# Patient Record
Sex: Female | Born: 1990 | Race: White | Hispanic: No | Marital: Single | Smoking: Never smoker
Health system: Southern US, Community
[De-identification: ages and names within clinical notes are randomized; demographics above are authoritative.]

## PROBLEM LIST (undated history)

## (undated) DIAGNOSIS — D242 Benign neoplasm of left breast: Secondary | ICD-10-CM

## (undated) DIAGNOSIS — D649 Anemia, unspecified: Secondary | ICD-10-CM

## (undated) DIAGNOSIS — F419 Anxiety disorder, unspecified: Secondary | ICD-10-CM

## (undated) DIAGNOSIS — M419 Scoliosis, unspecified: Secondary | ICD-10-CM

## (undated) DIAGNOSIS — D241 Benign neoplasm of right breast: Secondary | ICD-10-CM

## (undated) HISTORY — DX: Benign neoplasm of left breast: D24.2

## (undated) HISTORY — DX: Scoliosis, unspecified: M41.9

## (undated) HISTORY — DX: Anxiety disorder, unspecified: F41.9

## (undated) HISTORY — DX: Anemia, unspecified: D64.9

## (undated) HISTORY — DX: Benign neoplasm of right breast: D24.1

## (undated) HISTORY — PX: BREAST BIOPSY: SHX20

---
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
## 2013-08-24 ENCOUNTER — Ambulatory Visit: Payer: Self-pay | Admitting: Gastroenterology

## 2017-05-07 ENCOUNTER — Ambulatory Visit: Payer: 59 | Admitting: Obstetrics and Gynecology

## 2017-05-07 ENCOUNTER — Encounter: Payer: Self-pay | Admitting: Obstetrics and Gynecology

## 2017-05-07 VITALS — BP 120/80 | HR 97 | Ht 72.0 in | Wt 160.0 lb

## 2017-05-07 DIAGNOSIS — B373 Candidiasis of vulva and vagina: Secondary | ICD-10-CM

## 2017-05-07 DIAGNOSIS — Z113 Encounter for screening for infections with a predominantly sexual mode of transmission: Secondary | ICD-10-CM

## 2017-05-07 DIAGNOSIS — B3731 Acute candidiasis of vulva and vagina: Secondary | ICD-10-CM

## 2017-05-07 DIAGNOSIS — N898 Other specified noninflammatory disorders of vagina: Secondary | ICD-10-CM

## 2017-05-07 LAB — POCT WET PREP WITH KOH
CLUE CELLS WET PREP PER HPF POC: NEGATIVE
KOH Prep POC: NEGATIVE
TRICHOMONAS UA: NEGATIVE
Yeast Wet Prep HPF POC: NEGATIVE

## 2017-05-07 MED ORDER — FLUCONAZOLE 150 MG PO TABS: 150.0000 mg | ORAL_TABLET | Freq: Once | ORAL | 0 refills | Status: AC

## 2017-05-07 NOTE — Progress Notes (Signed)
Chief Complaint  Patient presents with  . Vaginitis    HPI:      [name] is a 26 y.o. G0P0000 who LMP was Patient's last menstrual period was 04/23/2017., presents today for NP eval of increased vag d/c, irritation and swelling, no odor for the past 4 days. She treated last night with 1 dose of monistat-7 with some relief.  She has had a recent new sex partner and would like STD testing.  She is on Y[place] and has noticed a little BTB since yesterday. No late/missed pills.   History reviewed. No pertinent past medical history.  Past Surgical History:  Procedure Laterality Date  . BREAST BIOPSY      Family History  Problem Relation Age of Onset  . Lung cancer Maternal Grandmother     Social History   Socioeconomic History  . Marital status: Single    Spouse name: Not on file  . Number of children: Not on file  . Years of education: Not on file  . Highest education level: Not on file  Social Needs  . Financial resource strain: Not on file  . Food insecurity - worry: Not on file  . Food insecurity - inability: Not on file  . Transportation needs - medical: Not on file  . Transportation needs - non-medical: Not on file  Occupational History  . Not on file  Tobacco Use  . Smoking status: Never Smoker  . Smokeless tobacco: Never Used  Substance and Sexual Activity  . Alcohol use: Yes    Frequency: Never  . Drug use: No  . Sexual activity: Yes    Birth control/protection: Pill  Other Topics Concern  . Not on file  Social History Narrative  . Not on file     Current Outpatient Medications:  .  fluconazole (DIFLUCAN) 150 MG tablet, Take 1 tablet (150 mg total) by mouth once for 1 dose., Disp: 1 tablet, Rfl: 0   ROS:  Review of Systems  Constitutional: Negative for fever.  Gastrointestinal: Negative for blood in stool, constipation, diarrhea, nausea and vomiting.  Genitourinary: Positive for vaginal discharge and vaginal pain. Negative for dyspareunia,  dysuria, flank pain, frequency, hematuria, urgency and vaginal bleeding.  Musculoskeletal: Negative for back pain.  Skin: Negative for rash.     OBJECTIVE:   Vitals:  BP 120/80   Pulse 97   Ht 6' (1.829 m)   Wt 160 lb (72.6 kg)   LMP 04/23/2017   BMI 21.70 kg/m   Physical Exam  Constitutional: She is oriented to person, place, and time and well-developed, well-nourished, and in no distress. Vital signs are normal.  Genitourinary: Uterus normal, cervix normal, right adnexa normal and left adnexa normal. Uterus is not enlarged. Cervix exhibits no motion tenderness and no tenderness. Right adnexum displays no mass and no tenderness. Left adnexum displays no mass and no tenderness. Vulva exhibits erythema and tenderness. Vulva exhibits no exudate, no lesion and no rash. Vagina exhibits no lesion. Thick  odorless  white and vaginal discharge found.  Neurological: She is oriented to person, place, and time.  Vitals reviewed.   Results: Results for orders placed or performed in visit on 05/07/17 (from the past 24 hour(s))  POCT Wet Prep with KOH     Status: Normal   Collection Time: 05/07/17  1:48 PM  Result Value Ref Range   Trichomonas, UA Negative    Clue Cells Wet Prep HPF POC neg    Epithelial Wet Prep HPF  POC  Few, Moderate, Many, Too numerous to count   Yeast Wet Prep HPF POC neg    Bacteria Wet Prep HPF POC  Few   RBC Wet Prep HPF POC     WBC Wet Prep HPF POC     KOH Prep POC Negative Negative     Assessment/Plan: [place] vaginitis - Neg wet prep/pos exam. Pt treated with monistat last night. Rx diflucan. F/u prn.  - Plan: POCT Wet Prep with KOH, fluconazole (DIFLUCAN) 150 MG tablet  Vaginal discharge - Plan: POCT Wet Prep with KOH  Screening for STD (sexually transmitted disease) - Plan: Chlamydia/Gonococcus/Trichomonas, NAA    Meds ordered this encounter  Medications  . fluconazole (DIFLUCAN) 150 MG tablet    Sig: Take 1 tablet (150 mg total) by mouth once for  1 dose.    Dispense:  1 tablet    Refill:  0      Return if symptoms worsen or fail to improve.  Alicia B. Copland, PA-C 05/07/2017 1:50 PM

## 2017-05-07 NOTE — Patient Instructions (Signed)
I value your feedback and entrusting us with your care. If you get a [facility] patient survey, I would appreciate you taking the time to let us know about your experience today. Thank you! 

## 2017-05-10 ENCOUNTER — Telehealth: Payer: Self-pay

## 2017-05-10 LAB — CHLAMYDIA/GONOCOCCUS/TRICHOMONAS, NAA
CHLAMYDIA BY NAA: NEGATIVE
GONOCOCCUS BY NAA: NEGATIVE
Trich vag by NAA: NEGATIVE

## 2017-05-10 NOTE — Telephone Encounter (Signed)
Pt called after hour nurse c/o increased itching and having blood in her discharge. She saw ABC on the 18th and was given diflucan, one dose, and told to use hydrocortisone cream for any itching. [phone]  Left detailed msg to call and schedule appt.

## 2017-05-21 DIAGNOSIS — D649 Anemia, unspecified: Secondary | ICD-10-CM

## 2017-05-21 HISTORY — DX: Anemia, unspecified: D64.9

## 2018-05-21 DIAGNOSIS — D241 Benign neoplasm of right breast: Secondary | ICD-10-CM

## 2018-05-21 HISTORY — DX: Benign neoplasm of right breast: D24.1

## 2018-09-04 ENCOUNTER — Encounter: Payer: Self-pay | Admitting: Obstetrics and Gynecology

## 2018-09-04 ENCOUNTER — Other Ambulatory Visit: Payer: Self-pay

## 2018-09-04 ENCOUNTER — Other Ambulatory Visit (HOSPITAL_COMMUNITY)
Admission: RE | Admit: 2018-09-04 | Discharge: 2018-09-04 | Disposition: A | Discharge status: Home / Self Care | Payer: 59 | Source: Ambulatory Visit | Attending: Obstetrics and Gynecology | Admitting: Obstetrics and Gynecology

## 2018-09-04 ENCOUNTER — Ambulatory Visit (INDEPENDENT_AMBULATORY_CARE_PROVIDER_SITE_OTHER): Payer: 59 | Admitting: Obstetrics and Gynecology

## 2018-09-04 VITALS — BP 116/64 | HR 108 | Ht 72.0 in | Wt 180.0 lb

## 2018-09-04 DIAGNOSIS — Z124 Encounter for screening for malignant neoplasm of cervix: Secondary | ICD-10-CM | POA: Diagnosis present

## 2018-09-04 DIAGNOSIS — Z113 Encounter for screening for infections with a predominantly sexual mode of transmission: Secondary | ICD-10-CM | POA: Insufficient documentation

## 2018-09-04 DIAGNOSIS — N921 Excessive and frequent menstruation with irregular cycle: Secondary | ICD-10-CM

## 2018-09-04 DIAGNOSIS — N6310 Unspecified lump in the right breast, unspecified quadrant: Secondary | ICD-10-CM

## 2018-09-04 DIAGNOSIS — D509 Iron deficiency anemia, unspecified: Secondary | ICD-10-CM

## 2018-09-04 NOTE — Progress Notes (Signed)
Patient, No Pcp Per   Chief Complaint  Patient presents with  . Metrorrhagia    on ocp's, shouldnt have cycles and has been bleeding for the last 3 weeks approx., regular cramping  . STD checking    HPI:      Ms. Blasa Raisch is a 28 y.o. G0P0000 who LMP was Patient's last menstrual period was 08/20/2018 (approximate)., presents today for irregular bleeding on OCPs for 3 wks without late/missed pills. Hx of anemia 12/19 with transfusion 1/20. Pt's home GYN changed her to continuous dosing of OCPs 1/20. Pt has been doing well except for light bleeding the past 3 wks. Pt also with new sex partner so concerned about STD exposure. No vag sx, no pelvic pain. Had 2 yeast vag episodes last month, treated with diflucan with sx relief.   Pt also has noted non-tender, RT breast lump for the past 5 days. Does regular SBE and noticed breast change. Hx of LT fibroadenoma exc a couple yrs ago. Feels the same. No FH breast/ovar ca.  Due for pap.  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   Past Medical History:  Diagnosis Date  . Anemia 2019  . Fibroadenoma of breast, left     Past Surgical History:  Procedure Laterality Date  . BREAST BIOPSY      Family History  Problem Relation Age of Onset  . Thyroid cancer Maternal Grandmother   . Breast cancer Neg Hx   . Ovarian cancer Neg Hx     Social History   Socioeconomic History  . Marital status: Single    Spouse name: Not on file  . Number of children: Not on file  . Years of education: Not on file  . Highest education level: Not on file  Occupational History  . Not on file  Social Needs  . Financial resource strain: Not on file  . Food insecurity:    Worry: Not on file    Inability: Not on file  . Transportation needs:    Medical: Not on file    Non-medical: Not on file  Tobacco Use  . Smoking status: Never Smoker  . Smokeless tobacco: Never Used  Substance and Sexual Activity  .  Alcohol use: Yes    Frequency: Never  . Drug use: No  . Sexual activity: Yes    Birth control/protection: Pill  Lifestyle  . Physical activity:    Days per week: Not on file    Minutes per session: Not on file  . Stress: Not on file  Relationships  . Social connections:    Talks on phone: Not on file    Gets together: Not on file    Attends religious service: Not on file    Active member of club or organization: Not on file    Attends meetings of clubs or organizations: Not on file    Relationship status: Not on file  . Intimate partner violence:    Fear of current or ex partner: Not on file    Emotionally abused: Not on file    Physically abused: Not on file    Forced sexual activity: Not on file  Other Topics Concern  . Not on file  Social History Narrative  . Not on file    Outpatient Medications Prior to Visit  Medication Sig Dispense Refill  . drospirenone-ethinyl estradiol (NIKKI) 3-0.02 MG tablet TAKE 1 TABLET BY MOUTH DAILY    .  escitalopram (LEXAPRO) 10 MG tablet Take by mouth.    . pantoprazole (PROTONIX) 20 MG tablet Take by mouth.     No facility-administered medications prior to visit.       ROS:  Review of Systems  Constitutional: Negative for fatigue, fever and unexpected weight change.  Respiratory: Negative for cough, shortness of breath and wheezing.   Cardiovascular: Negative for chest pain, palpitations and leg swelling.  Gastrointestinal: Negative for blood in stool, constipation, diarrhea, nausea and vomiting.  Endocrine: Negative for cold intolerance, heat intolerance and polyuria.  Genitourinary: Positive for menstrual problem. Negative for dyspareunia, dysuria, flank pain, frequency, genital sores, hematuria, pelvic pain, urgency, vaginal bleeding, vaginal discharge and vaginal pain.  Musculoskeletal: Negative for back pain, joint swelling and myalgias.  Skin: Negative for rash.  Neurological: Negative for dizziness, syncope,  light-headedness, numbness and headaches.  Hematological: Negative for adenopathy.  Psychiatric/Behavioral: Positive for agitation. Negative for confusion, sleep disturbance and suicidal ideas. The patient is not nervous/anxious.    BREAST: mass   OBJECTIVE:   Vitals:  BP 116/64   Pulse (!) 108   Ht 6' (1.829 m)   Wt 180 lb (81.6 kg)   LMP 08/20/2018 (Approximate)   BMI 24.41 kg/m   Physical Exam Vitals signs reviewed.  Constitutional:      Appearance: She is well-developed.  Neck:     Musculoskeletal: Normal range of motion.  Pulmonary:     Effort: Pulmonary effort is normal.  Chest:     Breasts: Breasts are symmetrical.        Right: Mass present. No inverted nipple, nipple discharge, skin change or tenderness.        Left: No inverted nipple, mass, nipple discharge, skin change or tenderness.    Genitourinary:    General: Normal vulva.     Pubic Area: No rash.      Labia:        Right: No rash, tenderness or lesion.        Left: No rash, tenderness or lesion.      Vagina: Normal. No vaginal discharge, erythema or tenderness.     Cervix: Normal.     Uterus: Normal. Not enlarged and not tender.      Adnexa: Right adnexa normal and left adnexa normal.       Right: No mass or tenderness.         Left: No mass or tenderness.       Comments: LIGHT RED D/C TODAY Musculoskeletal: Normal range of motion.  Skin:    General: Skin is warm and dry.  Neurological:     General: No focal deficit present.     Mental Status: She is alert and oriented to person, place, and time.  Psychiatric:        Mood and Affect: Mood normal.        Behavior: Behavior normal.        Thought Content: Thought content normal.        Judgment: Judgment normal.     Assessment/Plan: Breakthrough bleeding on OCPs - Doing continuous dosing since 1/20. Check STDs. If neg, most likely due to cont dosing. Can do placebo pills and then restart active pills.   Cervical cancer screening - Plan:  Cytology - PAP  Screening for STD (sexually transmitted disease) - Plan: Cytology - PAP, CANCELED: Cervicovaginal ancillary only  Breast mass, right - 6:00 under nipple. Check breast u/s. If neg, most likely normal breast tissue. Will f/u with resutls.  -  Plan: US BREAST LTD UNI RIGHT INC AXILLA  Iron deficiency anemia, unspecified iron deficiency anemia type - Question cause. Pt getting further eval with home GYN/PCP.    Return if symptoms worsen or fail to improve.  Sharah Finnell B. Sheretha Shadd, PA-C 09/04/2018 10:17 AM

## 2018-09-04 NOTE — Patient Instructions (Signed)
I value your feedback and entrusting us with your care. If you get a [facility] patient survey, I would appreciate you taking the time to let us know about your experience today. Thank you! 

## 2018-09-06 LAB — CYTOLOGY - PAP
Chlamydia: NEGATIVE
Diagnosis: NEGATIVE
Neisseria Gonorrhea: NEGATIVE

## 2018-09-08 ENCOUNTER — Ambulatory Visit
Admission: RE | Admit: 2018-09-08 | Discharge: 2018-09-08 | Disposition: A | Discharge status: Home / Self Care | Payer: 59 | Source: Ambulatory Visit | Attending: Obstetrics and Gynecology | Admitting: Obstetrics and Gynecology

## 2018-09-08 ENCOUNTER — Telehealth: Payer: Self-pay

## 2018-09-08 ENCOUNTER — Other Ambulatory Visit: Payer: Self-pay

## 2018-09-08 DIAGNOSIS — N6310 Unspecified lump in the right breast, unspecified quadrant: Secondary | ICD-10-CM | POA: Diagnosis not present

## 2018-09-08 NOTE — Progress Notes (Signed)
Pls let pt know pap and STD testing neg. If still having BTB with OCPs, pt should do placebo wk and then start active pills again. F/u prn.

## 2018-09-08 NOTE — Telephone Encounter (Signed)
Patient is returning missed call. Please advise 

## 2018-09-08 NOTE — Telephone Encounter (Signed)
Pt calling for pap results.  [phone]  Mailbox full.

## 2018-09-08 NOTE — Progress Notes (Signed)
Called pt, no answer, could not leave a vm due to mailbox full.

## 2018-09-08 NOTE — Telephone Encounter (Signed)
Pt aware of results and ABC's recommendations.

## 2018-09-09 ENCOUNTER — Encounter: Payer: Self-pay | Admitting: Obstetrics and Gynecology

## 2018-09-09 ENCOUNTER — Other Ambulatory Visit: Payer: Self-pay | Admitting: Obstetrics and Gynecology

## 2018-09-09 DIAGNOSIS — N6310 Unspecified lump in the right breast, unspecified quadrant: Secondary | ICD-10-CM

## 2018-09-11 ENCOUNTER — Telehealth: Payer: Self-pay

## 2018-09-11 ENCOUNTER — Other Ambulatory Visit: Payer: Self-pay | Admitting: Obstetrics and Gynecology

## 2018-09-11 MED ORDER — DROSPIRENONE-ETHINYL ESTRADIOL 3-0.02 MG PO TABS: 1.0000 | ORAL_TABLET | Freq: Every day | ORAL | 0 refills | Status: DC

## 2018-09-11 NOTE — Progress Notes (Signed)
Rx RF till can have annual due to covid

## 2018-09-11 NOTE — Telephone Encounter (Signed)
Rx RF eRxd.  

## 2018-09-11 NOTE — Telephone Encounter (Signed)
Pt saw ABC a few days ago. She is out of refills of birth control. She can't see her PCP who is in [place] who denied her refill request. Cb#[phone].

## 2018-11-29 ENCOUNTER — Other Ambulatory Visit: Payer: Self-pay | Admitting: Obstetrics and Gynecology

## 2018-12-03 ENCOUNTER — Other Ambulatory Visit: Payer: Self-pay

## 2018-12-03 ENCOUNTER — Telehealth: Payer: Self-pay

## 2018-12-03 MED ORDER — DROSPIRENONE-ETHINYL ESTRADIOL 3-0.02 MG PO TABS: 1.0000 | ORAL_TABLET | Freq: Every day | ORAL | 2 refills | Status: DC

## 2018-12-03 NOTE — Telephone Encounter (Signed)
Pt calling for refill of rx.  [phone]

## 2018-12-03 NOTE — Telephone Encounter (Signed)
Patient was seen on 09/04/18 pap smear was done at this visit

## 2018-12-03 NOTE — Telephone Encounter (Signed)
Pt is asking for [facility] RF. Her question was, does she need to come in for "annual visit"?

## 2018-12-03 NOTE — Telephone Encounter (Signed)
Please advise if medication follow up is needed. Does this need to be schedule as telephone consult or in office.

## 2018-12-03 NOTE — Telephone Encounter (Signed)
Called pt, no answer, LVMTRC. 

## 2018-12-03 NOTE — Telephone Encounter (Signed)
Pt says no more BTB. RF sent to pharmacy.

## 2018-12-03 NOTE — Addendum Note (Signed)
Addended by: Cleophas Dunker D on: 12/03/2018 03:35 PM   Modules accepted: Orders

## 2018-12-03 NOTE — Telephone Encounter (Signed)
Pls call pt to find out what is going on with her meds. I can just call her back. Doesn't need appt. Thx.

## 2018-12-03 NOTE — Telephone Encounter (Signed)
Pt aware refill eRx'd. 

## 2018-12-03 NOTE — Telephone Encounter (Signed)
No need for annual since did breast/pelvic/pap at 4/20 appt. Did pt's BTB resolved on OCPs? If so, will send in RF to get her to 4/21.

## 2019-05-28 NOTE — Patient Instructions (Signed)
I value your feedback and entrusting us with your care. If you get a [facility] patient survey, I would appreciate you taking the time to let us know about your experience today. Thank you!  As of April 30, 2019, your lab results will be released to your MyChart immediately, before I even have a chance to see them. Please give me time to review them and contact you if there are any abnormalities. Thank you for your patience.  

## 2019-05-28 NOTE — Progress Notes (Signed)
Patient, No Pcp Per   Chief Complaint  Patient presents with  . Gynecologic Exam    bleeding w/intercourse full menses (amenorrhea on cont'd OCP)    HPI:      [name] is a 29 y.o. G0P0000 who LMP was Patient's last menstrual period was 05/21/2019 (exact date)., presents today for irregular bleeding and STD testing. Pt does cont dosing of OCPs for hx of anemia. Hasn't had bleeding in a long time, but started bleeding a couple wks ago. Pt then did 4 days placebo pills with mod flow cycle and mild dysmen, and restarted active pills 5 days ago. Still having bleeding but it's lighter. Also noticed a strong odor with bleeding. Hx of BV in past and concerned about that. No recent abx use.  She is sex active with new partner, wants STD testing. Not using condoms. No pain/bleeding with sex. Normal pap 4/20.  Patient Active Problem List   Diagnosis Date Noted  . Iron deficiency anemia 09/04/2018    Past Surgical History:  Procedure Laterality Date  . BREAST BIOPSY      Family History  Problem Relation Age of Onset  . Thyroid cancer Maternal Grandmother   . Breast cancer Neg Hx   . Ovarian cancer Neg Hx     Social History   Socioeconomic History  . Marital status: Single    Spouse name: Not on file  . Number of children: Not on file  . Years of education: Not on file  . Highest education level: Not on file  Occupational History  . Not on file  Tobacco Use  . Smoking status: Never Smoker  . Smokeless tobacco: Never Used  Substance and Sexual Activity  . Alcohol use: Yes  . Drug use: No  . Sexual activity: Yes    Birth control/protection: Pill  Other Topics Concern  . Not on file  Social History Narrative  . Not on file   Social Determinants of Health   Financial Resource Strain:   . Difficulty of Paying Living Expenses: Not on file  Food Insecurity:   . Worried About Charity fundraiser in the Last Year: Not on file  . Ran Out of Food in the Last Year:  Not on file  Transportation Needs:   . Lack of Transportation (Medical): Not on file  . Lack of Transportation (Non-Medical): Not on file  Physical Activity:   . Days of Exercise per Week: Not on file  . Minutes of Exercise per Session: Not on file  Stress:   . Feeling of Stress : Not on file  Social Connections:   . Frequency of Communication with Friends and Family: Not on file  . Frequency of Social Gatherings with Friends and Family: Not on file  . Attends Religious Services: Not on file  . Active Member of Clubs or Organizations: Not on file  . Attends Archivist Meetings: Not on file  . Marital Status: Not on file  Intimate Partner Violence:   . Fear of Current or Ex-Partner: Not on file  . Emotionally Abused: Not on file  . Physically Abused: Not on file  . Sexually Abused: Not on file    Outpatient Medications Prior to Visit  Medication Sig Dispense Refill  . drospirenone-ethinyl estradiol (NIKKI) 3-0.02 MG tablet Take 1 tablet by mouth daily. 3 Package 2  . pantoprazole (PROTONIX) 20 MG tablet Take by mouth.    . escitalopram (LEXAPRO) 10 MG tablet Take by mouth.  No facility-administered medications prior to visit.      ROS:  Review of Systems  Constitutional: Negative for fever.  Gastrointestinal: Negative for blood in stool, constipation, diarrhea, nausea and vomiting.  Genitourinary: Positive for menstrual problem. Negative for dyspareunia, dysuria, flank pain, frequency, hematuria, urgency, vaginal bleeding, vaginal discharge and vaginal pain.  Musculoskeletal: Negative for back pain.  Skin: Negative for rash.  Psychiatric/Behavioral: Positive for agitation.    OBJECTIVE:   Vitals:  BP 128/80 (BP Location: Left Arm, Patient Position: Sitting, Cuff Size: Normal)   Pulse (!) 112   Ht 6' (1.829 m)   Wt 197 lb (89.4 kg)   LMP 05/21/2019 (Exact Date) Comment: cotinuous dosing  BMI 26.72 kg/m   Physical Exam Vitals reviewed.   Constitutional:      Appearance: She is well-developed.  Pulmonary:     Effort: Pulmonary effort is normal.  Genitourinary:    General: Normal vulva.     Pubic Area: No rash.      Labia:        Right: No rash, tenderness or lesion.        Left: No rash, tenderness or lesion.      Vagina: Normal. No vaginal discharge, erythema, tenderness or bleeding.     Cervix: Normal.     Uterus: Normal. Not enlarged and not tender.      Adnexa: Right adnexa normal and left adnexa normal.       Right: No mass or tenderness.         Left: No mass or tenderness.       Comments: NO BLEEDING ON VAG EXAM Musculoskeletal:        General: Normal range of motion.     Cervical back: Normal range of motion.  Skin:    General: Skin is warm and dry.  Neurological:     General: No focal deficit present.     Mental Status: She is alert and oriented to person, place, and time.  Psychiatric:        Mood and Affect: Mood normal.        Behavior: Behavior normal.        Thought Content: Thought content normal.        Judgment: Judgment normal.     Results: Results for orders placed or performed in visit on 05/29/19 (from the past 24 hour(s))  POCT Wet Prep with KOH     Status: Normal   Collection Time: 05/29/19  9:28 AM  Result Value Ref Range   Trichomonas, UA Negative    Clue Cells Wet Prep HPF POC neg    Epithelial Wet Prep HPF POC     Yeast Wet Prep HPF POC neg    Bacteria Wet Prep HPF POC     RBC Wet Prep HPF POC     WBC Wet Prep HPF POC     KOH Prep POC Negative Negative  POCT urine pregnancy     Status: Normal   Collection Time: 05/29/19  9:28 AM  Result Value Ref Range   Preg Test, Ur Negative Negative     Assessment/Plan: Breakthrough bleeding on OCPs--Neg UPT, neg exam. Bleeding is improving on active pills. Rule out STD. If neg, reassurance. F/u prn.   Screening for STD (sexually transmitted disease) - Plan: [place] STD  Vaginal odor - Plan: POCT Wet Prep with KOH; neg wet prep, check  STD testing. Will call with results. If neg, could be hormonal from bleeding. F/u prn.  Return if symptoms worsen or fail to improve.  Alyze Lauf B. Chiante Peden, PA-C 05/29/2019 9:30 AM

## 2019-05-29 ENCOUNTER — Encounter: Payer: Self-pay | Admitting: Obstetrics and Gynecology

## 2019-05-29 ENCOUNTER — Other Ambulatory Visit (HOSPITAL_COMMUNITY)
Admission: RE | Admit: 2019-05-29 | Discharge: 2019-05-29 | Disposition: A | Discharge status: Home / Self Care | Payer: Commercial Managed Care - PPO | Source: Ambulatory Visit | Attending: Obstetrics and Gynecology | Admitting: Obstetrics and Gynecology

## 2019-05-29 ENCOUNTER — Other Ambulatory Visit: Payer: Self-pay

## 2019-05-29 ENCOUNTER — Ambulatory Visit (INDEPENDENT_AMBULATORY_CARE_PROVIDER_SITE_OTHER): Payer: Commercial Managed Care - PPO | Admitting: Obstetrics and Gynecology

## 2019-05-29 VITALS — BP 128/80 | HR 112 | Ht 72.0 in | Wt 197.0 lb

## 2019-05-29 DIAGNOSIS — N921 Excessive and frequent menstruation with irregular cycle: Secondary | ICD-10-CM

## 2019-05-29 DIAGNOSIS — N898 Other specified noninflammatory disorders of vagina: Secondary | ICD-10-CM

## 2019-05-29 DIAGNOSIS — Z113 Encounter for screening for infections with a predominantly sexual mode of transmission: Secondary | ICD-10-CM

## 2019-05-29 DIAGNOSIS — Z3202 Encounter for pregnancy test, result negative: Secondary | ICD-10-CM | POA: Diagnosis not present

## 2019-05-29 LAB — POCT WET PREP WITH KOH
Clue Cells Wet Prep HPF POC: NEGATIVE
KOH Prep POC: NEGATIVE
Trichomonas, UA: NEGATIVE
Yeast Wet Prep HPF POC: NEGATIVE

## 2019-05-29 LAB — POCT URINE PREGNANCY: Preg Test, Ur: NEGATIVE

## 2019-06-01 LAB — CERVICOVAGINAL ANCILLARY ONLY
Chlamydia: NEGATIVE
Comment: NEGATIVE
Comment: NORMAL
Neisseria Gonorrhea: NEGATIVE

## 2019-06-02 NOTE — Progress Notes (Signed)
Pls let pt know STD testing neg. Thx.

## 2019-06-02 NOTE — Progress Notes (Signed)
Called pt, no answer, LVMTRC. 

## 2019-06-03 ENCOUNTER — Telehealth: Payer: Self-pay | Admitting: Obstetrics and Gynecology

## 2019-06-03 NOTE — Telephone Encounter (Signed)
Patient is calling for results. Patient is currently at work and she will call back to get results

## 2019-06-03 NOTE — Telephone Encounter (Signed)
Pt aware.

## 2019-06-04 NOTE — Telephone Encounter (Signed)
Pls let pt know to continue active OCPs, even if she is still having BTB. If still has BTB after 3 wks of active pills, then do 7 days placebo pills, have period, and then restart continuous dosing of pills. F/u prn.

## 2019-06-04 NOTE — Telephone Encounter (Signed)
Left voice message advising pt of ABC advice

## 2019-06-04 NOTE — Telephone Encounter (Signed)
Can you pls call this pt to give her ABC's advise. No phone out here.

## 2019-06-30 ENCOUNTER — Telehealth: Payer: Self-pay

## 2019-06-30 NOTE — Telephone Encounter (Signed)
She doesn't need a PCP ref. Call any [facility] office to schedule an appt.

## 2019-06-30 NOTE — Telephone Encounter (Signed)
Pt wants to know if you can put in a referral for her a PCP or a therapist.

## 2019-06-30 NOTE — Telephone Encounter (Signed)
Called pt, no answer, could not leave voice msg due to mail box full. 

## 2019-07-01 NOTE — Telephone Encounter (Signed)
Tried again, no answer, mailbox full. 

## 2019-07-02 NOTE — Telephone Encounter (Signed)
Trying to call pt today again, she called triage. Please let me know when she calls back. She is wanting a referral for PCP and does not need one.

## 2019-11-04 ENCOUNTER — Other Ambulatory Visit: Payer: Self-pay

## 2019-11-04 ENCOUNTER — Ambulatory Visit (INDEPENDENT_AMBULATORY_CARE_PROVIDER_SITE_OTHER): Payer: Commercial Managed Care - PPO | Admitting: Obstetrics and Gynecology

## 2019-11-04 ENCOUNTER — Encounter: Payer: Self-pay | Admitting: Obstetrics and Gynecology

## 2019-11-04 VITALS — BP 110/70 | Ht 71.0 in | Wt 179.0 lb

## 2019-11-04 DIAGNOSIS — N921 Excessive and frequent menstruation with irregular cycle: Secondary | ICD-10-CM

## 2019-11-04 LAB — POCT URINE PREGNANCY: Preg Test, Ur: NEGATIVE

## 2019-11-04 NOTE — Progress Notes (Signed)
Patient, No Pcp Per   Chief Complaint  Patient presents with  . Menstrual Problem    two periods this month    HPI:      [name] is a 29 y.o. G0P0000 whose LMP was Patient's last menstrual period was 11/03/2019 (exact date)., presents today for BTB with OCPs again. Seen 1/21 for similar sx. Pt does cont dosing OCPs for menorrhagia with hx of anemia. Can't go Q3 months without bleeding, so usually goes as long as she can, then starts placebo pills. Has 3-4 day periods, mod flow, no dysmen. Had period 09/06/19. Had bleeding again 10/20/19, did placebo pills, started new pack, on day 6 now and started cramping and bleeding again yesterday. Feels fatigued and worn out. CBC 4/21 with PCP showed iron deficiency anemia with low ferritin. Had neg endoscopy/colonoscopy 9/20. Taking Fe supp daily. Pt is recovering alcoholic for 2 months. Has lost 21# with diet/exercise changes in past 2 months. Is under increased stress, also took zpak end of May. No late/missed pills. Really likes current pills Lexine Baton) and would like to stay on them if possible.   She is sex active. Neg STD testing 1/21. No new partners. Occas has spotting with sex, no pain.   Neg pap 4/20. Was seeing GYN in [place] but has moved back.   Past Medical History:  Diagnosis Date  . Anemia 2019  . Fibroadenoma of breast, left   . Fibroadenoma of breast, right 2020   per u/s    Past Surgical History:  Procedure Laterality Date  . BREAST BIOPSY      Family History  Problem Relation Age of Onset  . Thyroid cancer Maternal Grandmother   . Breast cancer Neg Hx   . Ovarian cancer Neg Hx     Social History   Socioeconomic History  . Marital status: Single    Spouse name: Not on file  . Number of children: Not on file  . Years of education: Not on file  . Highest education level: Not on file  Occupational History  . Not on file  Tobacco Use  . Smoking status: Never Smoker  . Smokeless tobacco: Never Used    Vaping Use  . Vaping Use: Never used  Substance and Sexual Activity  . Alcohol use: Yes  . Drug use: No  . Sexual activity: Yes    Birth control/protection: Pill  Other Topics Concern  . Not on file  Social History Narrative  . Not on file   Social Determinants of Health   Financial Resource Strain:   . Difficulty of Paying Living Expenses:   Food Insecurity:   . Worried About Charity fundraiser in the Last Year:   . Arboriculturist in the Last Year:   Transportation Needs:   . Film/video editor (Medical):   Marland Kitchen Lack of Transportation (Non-Medical):   Physical Activity:   . Days of Exercise per Week:   . Minutes of Exercise per Session:   Stress:   . Feeling of Stress :   Social Connections:   . Frequency of Communication with Friends and Family:   . Frequency of Social Gatherings with Friends and Family:   . Attends Religious Services:   . Active Member of Clubs or Organizations:   . Attends Archivist Meetings:   Marland Kitchen Marital Status:   Intimate Partner Violence:   . Fear of Current or Ex-Partner:   . Emotionally Abused:   Marland Kitchen Physically  Abused:   . Sexually Abused:     Outpatient Medications Prior to Visit  Medication Sig Dispense Refill  . drospirenone-ethinyl estradiol (NIKKI) 3-0.02 MG tablet Take 1 tablet by mouth daily. 3 Package 2  . pantoprazole (PROTONIX) 40 MG tablet Take 40 mg by mouth every morning.    . escitalopram (LEXAPRO) 10 MG tablet Take by mouth.    . pantoprazole (PROTONIX) 20 MG tablet Take by mouth.     No facility-administered medications prior to visit.      ROS:  Review of Systems  Constitutional: Positive for fatigue. Negative for fever.  Gastrointestinal: Negative for blood in stool, constipation, diarrhea, nausea and vomiting.  Genitourinary: Positive for menstrual problem. Negative for dyspareunia, dysuria, flank pain, frequency, hematuria, urgency, vaginal bleeding, vaginal discharge and vaginal pain.   Musculoskeletal: Negative for back pain.  Skin: Negative for rash.    OBJECTIVE:   Vitals:  BP 110/70   Ht 5\' 11"  (1.803 m)   Wt 179 lb (81.2 kg)   LMP 11/03/2019 (Exact Date)   BMI 24.97 kg/m   Physical Exam Vitals reviewed.  Constitutional:      Appearance: She is well-developed.  Pulmonary:     Effort: Pulmonary effort is normal.  Genitourinary:    General: Normal vulva.     Pubic Area: No rash.      Labia:        Right: No rash, tenderness or lesion.        Left: No rash, tenderness or lesion.      Vagina: Bleeding present. No vaginal discharge, erythema or tenderness.     Cervix: Normal.     Uterus: Normal. Not enlarged and not tender.      Adnexa: Right adnexa normal and left adnexa normal.       Right: No mass or tenderness.         Left: No mass or tenderness.    Musculoskeletal:        General: Normal range of motion.     Cervical back: Normal range of motion.  Skin:    General: Skin is warm and dry.  Neurological:     General: No focal deficit present.     Mental Status: She is alert and oriented to person, place, and time.  Psychiatric:        Mood and Affect: Mood normal.        Behavior: Behavior normal.        Thought Content: Thought content normal.        Judgment: Judgment normal.     Results: Results for orders placed or performed in visit on 11/04/19 (from the past 24 hour(s))  POCT urine pregnancy     Status: Normal   Collection Time: 11/04/19  2:46 PM  Result Value Ref Range   Preg Test, Ur Negative Negative     Assessment/Plan: Breakthrough bleeding on OCPs - Plan: POCT urine pregnancy--Neg UPT. Recent wt loss, illness, increased stress. No late/ missed pills. Most likely due to stressors. Finish pill pack and see what cycle does next month. If still with BTB or BTB doesn't stop this cycle, will check labs and GYN u/s. If neg, can switch to different OCP vs depo. Pt loves current pills and would prefer to cont them.     Return if  symptoms worsen or fail to improve.  Rene Gonsoulin B. Gradie Butrick, PA-C 11/04/2019 2:49 PM

## 2019-11-04 NOTE — Patient Instructions (Signed)
I value your feedback and entrusting us with your care. If you get a [employer] patient survey, I would appreciate you taking the time to let us know about your experience today. Thank you!  As of April 30, 2019, your lab results will be released to your MyChart immediately, before I even have a chance to see them. Please give me time to review them and contact you if there are any abnormalities. Thank you for your patience.  

## 2019-12-16 ENCOUNTER — Other Ambulatory Visit: Payer: Self-pay | Admitting: Obstetrics and Gynecology

## 2019-12-17 ENCOUNTER — Telehealth: Payer: Self-pay

## 2019-12-17 DIAGNOSIS — Z1329 Encounter for screening for other suspected endocrine disorder: Secondary | ICD-10-CM

## 2019-12-17 DIAGNOSIS — N921 Excessive and frequent menstruation with irregular cycle: Secondary | ICD-10-CM

## 2019-12-17 DIAGNOSIS — D509 Iron deficiency anemia, unspecified: Secondary | ICD-10-CM

## 2019-12-17 NOTE — Telephone Encounter (Signed)
Pt left msg on triage line asking to talk to ABC about the spotting/bleeding she has been having. Feels bloated. Called her back twice to get more details, no answer, voice mail full.

## 2019-12-17 NOTE — Telephone Encounter (Signed)
Pt would like to schedule an appointment, ABC said she would need a u/s of this continued

## 2019-12-18 ENCOUNTER — Other Ambulatory Visit: Payer: Self-pay | Admitting: Obstetrics and Gynecology

## 2019-12-18 MED ORDER — DROSPIRENONE-ETHINYL ESTRADIOL 3-0.02 MG PO TABS: 1.0000 | ORAL_TABLET | Freq: Every day | ORAL | 0 refills | Status: DC

## 2019-12-18 NOTE — Telephone Encounter (Signed)
I sent in a refill so she can continue her pills. Thx.

## 2019-12-18 NOTE — Telephone Encounter (Signed)
Last question (hopefully), she is on her last pill but doesn't know if she should stop it or what? If she can continue can a refill be sent in?

## 2019-12-18 NOTE — Telephone Encounter (Signed)
No, I'll call her so I can look at labs and u/s. Thx

## 2019-12-18 NOTE — Telephone Encounter (Signed)
Will need u/s order

## 2019-12-18 NOTE — Telephone Encounter (Signed)
scheduled

## 2019-12-18 NOTE — Telephone Encounter (Signed)
U/S order placed (JP). Pt needs labs also. Orders placed. GA--Pls notify pt to get labs (put on lab schedule) with u/s. Doesn't need to be fasting. Thx.

## 2019-12-18 NOTE — Telephone Encounter (Signed)
Thank you :)

## 2019-12-18 NOTE — Progress Notes (Signed)
Rx RF [employer]

## 2019-12-18 NOTE — Telephone Encounter (Signed)
Does she need to see you after u/s or are you going to call her? Right now I have her on u/s sched and to see you after,

## 2019-12-18 NOTE — Telephone Encounter (Signed)
Please see

## 2019-12-21 ENCOUNTER — Other Ambulatory Visit: Payer: Self-pay

## 2019-12-21 ENCOUNTER — Other Ambulatory Visit: Payer: Commercial Managed Care - PPO

## 2019-12-21 ENCOUNTER — Ambulatory Visit (INDEPENDENT_AMBULATORY_CARE_PROVIDER_SITE_OTHER): Payer: Commercial Managed Care - PPO

## 2019-12-21 DIAGNOSIS — N921 Excessive and frequent menstruation with irregular cycle: Secondary | ICD-10-CM

## 2019-12-21 DIAGNOSIS — D509 Iron deficiency anemia, unspecified: Secondary | ICD-10-CM

## 2019-12-21 DIAGNOSIS — Z1329 Encounter for screening for other suspected endocrine disorder: Secondary | ICD-10-CM

## 2019-12-22 ENCOUNTER — Telehealth: Payer: Self-pay | Admitting: Obstetrics and Gynecology

## 2019-12-22 ENCOUNTER — Encounter: Payer: Self-pay | Admitting: Obstetrics and Gynecology

## 2019-12-22 LAB — CBC WITH DIFFERENTIAL/PLATELET
Basophils Absolute: 0 10*3/uL (ref 0.0–0.2)
Basos: 0 %
EOS (ABSOLUTE): 0.1 10*3/uL (ref 0.0–0.4)
Eos: 1 %
Hematocrit: 36.8 % (ref 34.0–46.6)
Hemoglobin: 10.5 g/dL — ABNORMAL LOW (ref 11.1–15.9)
Immature Grans (Abs): 0 10*3/uL (ref 0.0–0.1)
Immature Granulocytes: 0 %
Lymphocytes Absolute: 2 10*3/uL (ref 0.7–3.1)
Lymphs: 27 %
MCH: 21.6 pg — ABNORMAL LOW (ref 26.6–33.0)
MCHC: 28.5 g/dL — ABNORMAL LOW (ref 31.5–35.7)
MCV: 76 fL — ABNORMAL LOW (ref 79–97)
Monocytes Absolute: 0.5 10*3/uL (ref 0.1–0.9)
Monocytes: 7 %
Neutrophils Absolute: 4.7 10*3/uL (ref 1.4–7.0)
Neutrophils: 65 %
Platelets: 375 10*3/uL (ref 150–450)
RBC: 4.85 x10E6/uL (ref 3.77–5.28)
RDW: 15.9 % — ABNORMAL HIGH (ref 11.7–15.4)
WBC: 7.3 10*3/uL (ref 3.4–10.8)

## 2019-12-22 LAB — TSH+FREE T4
Free T4: 1.12 ng/dL (ref 0.82–1.77)
TSH: 2.43 u[IU]/mL (ref 0.450–4.500)

## 2019-12-22 NOTE — Telephone Encounter (Signed)
Patient is calling to follow up on lab results. Patient is currently at work and would like for a detailed message left on voicemail

## 2019-12-22 NOTE — Telephone Encounter (Signed)
LM for pt. GYN u/s WNL for BTB with OCPs. EM very thin. Suggested increasing estrogen dose of OCPs to stabilize bleeding. Asked pt to call me back.  Pt aware of labs. Still waiting for prolactin. Thyroid normal. Still anemic, taking Fe supp. Slightly improved CBC from 4/21.

## 2019-12-23 ENCOUNTER — Other Ambulatory Visit: Payer: Self-pay | Admitting: Obstetrics and Gynecology

## 2019-12-23 MED ORDER — DROSPIRENONE-ETHINYL ESTRADIOL 3-0.03 MG PO TABS: 1.0000 | ORAL_TABLET | Freq: Every day | ORAL | 1 refills | Status: DC

## 2019-12-23 NOTE — Progress Notes (Signed)
Rx yasmin due to BTB with yaz and very thin EM. Increase estrogen dose to see if sx improve.

## 2019-12-24 ENCOUNTER — Ambulatory Visit: Payer: Commercial Managed Care - PPO | Admitting: Obstetrics and Gynecology

## 2019-12-24 ENCOUNTER — Other Ambulatory Visit: Payer: Commercial Managed Care - PPO

## 2020-01-07 ENCOUNTER — Other Ambulatory Visit: Payer: Self-pay | Admitting: Obstetrics and Gynecology

## 2020-06-15 ENCOUNTER — Other Ambulatory Visit: Payer: Self-pay | Admitting: Obstetrics and Gynecology

## 2020-06-16 ENCOUNTER — Telehealth: Payer: Self-pay

## 2020-06-16 MED ORDER — DROSPIRENONE-ETHINYL ESTRADIOL 3-0.03 MG PO TABS: 1.0000 | ORAL_TABLET | Freq: Every day | ORAL | 0 refills | Status: DC

## 2020-06-16 NOTE — Telephone Encounter (Signed)
Rx RF sent, pt aware.

## 2020-06-16 NOTE — Addendum Note (Signed)
Addended by: Drenda Freeze on: 06/16/2020 01:26 PM   Modules accepted: Orders

## 2020-06-16 NOTE — Telephone Encounter (Signed)
Pt left msg on triage requesting BC RF. She is due for an annual. Pls call pt to schedule appt then forward back to me to send RF enough to last to appt.

## 2020-06-16 NOTE — Telephone Encounter (Signed)
Patient coming in on 07/15/2020 at 9:30 with MMF for her annual

## 2020-07-15 ENCOUNTER — Ambulatory Visit: Payer: Commercial Managed Care - PPO | Admitting: Advanced Practice Midwife

## 2020-07-15 ENCOUNTER — Ambulatory Visit: Payer: Commercial Managed Care - PPO | Admitting: Obstetrics

## 2020-07-28 ENCOUNTER — Ambulatory Visit: Payer: Commercial Managed Care - PPO | Admitting: Obstetrics and Gynecology

## 2020-08-08 NOTE — Patient Instructions (Signed)
I value your feedback and you entrusting us with your care. If you get a [facility] patient survey, I would appreciate you taking the time to let us know about your experience today. Thank you! ? ? ?

## 2020-08-08 NOTE — Progress Notes (Signed)
PCP:  Patient, No Pcp Per   Chief Complaint  Patient presents with  . Gynecologic Exam  . Vaginal Odor    Fishy odor, no itchiness or discharge x 2 weeks     HPI:      [name] is a 30 y.o. G0P0000 whose LMP was No LMP recorded. (Menstrual status: Oral contraceptives)., presents today for her annual examination.  Her menses are Q2-3 months with cont dosing OCPs, lasting 5 days.  Dysmenorrhea mild to mod, improved somewhat with NSAIDs. She does not have intermenstrual bleeding anymore.   Sex activity: single partner, contraception - OCP (estrogen/progesterone).  Last Pap: 09/04/18  Results were: no abnormalities  Hx of STDs: none; wants STD testing today.  Has had fishy odor for a couple wks. Started with increased d/c and irritation that resolved. No meds to treat. Hx of BV in past.   There is no FH of breast cancer. There is no FH of ovarian cancer. The patient does do self-breast exams. Hx of stable RT breast fibroadenoma. Would like breast reduction, plans to see plastic surgeon.  Tobacco use: The patient denies current or previous tobacco use. Alcohol use: none No drug use.  Exercise: not active  She does get adequate calcium but not Vitamin D in her diet.   Past Medical History:  Diagnosis Date  . Anemia 2019  . Fibroadenoma of breast, left   . Fibroadenoma of breast, right 2020   per u/s    Past Surgical History:  Procedure Laterality Date  . BREAST BIOPSY      Family History  Problem Relation Age of Onset  . Thyroid cancer Maternal Grandmother   . Breast cancer Neg Hx   . Ovarian cancer Neg Hx     Social History   Socioeconomic History  . Marital status: Single    Spouse name: Not on file  . Number of children: Not on file  . Years of education: Not on file  . Highest education level: Not on file  Occupational History  . Not on file  Tobacco Use  . Smoking status: Never Smoker  . Smokeless tobacco: Never Used  Vaping Use  . Vaping Use:  Never used  Substance and Sexual Activity  . Alcohol use: Yes  . Drug use: No  . Sexual activity: Yes    Birth control/protection: Pill  Other Topics Concern  . Not on file  Social History Narrative  . Not on file   Social Determinants of Health   Financial Resource Strain: Not on file  Food Insecurity: Not on file  Transportation Needs: Not on file  Physical Activity: Not on file  Stress: Not on file  Social Connections: Not on file  Intimate Partner Violence: Not on file     Current Outpatient Medications:  .  escitalopram (LEXAPRO) 20 MG tablet, Take 20 mg by mouth daily., Disp: , Rfl:  .  metroNIDAZOLE (METROGEL) 0.75 % vaginal gel, Place 1 Applicatorful vaginally at bedtime for 5 days., Disp: 50 g, Rfl: 0 .  pantoprazole (PROTONIX) 40 MG tablet, Take 40 mg by mouth every morning., Disp: , Rfl:  .  drospirenone-ethinyl estradiol (YASMIN) 3-0.03 MG tablet, Take 1 tablet by mouth daily. CONTINUOUS DOSING, Disp: 84 tablet, Rfl: 3     ROS:  Review of Systems  Constitutional: Negative for fatigue, fever and unexpected weight change.  Respiratory: Negative for cough, shortness of breath and wheezing.   Cardiovascular: Negative for chest pain, palpitations and leg swelling.  Gastrointestinal: Negative for blood in stool, constipation, diarrhea, nausea and vomiting.  Endocrine: Negative for cold intolerance, heat intolerance and polyuria.  Genitourinary: Positive for vaginal discharge. Negative for dyspareunia, dysuria, flank pain, frequency, genital sores, hematuria, menstrual problem, pelvic pain, urgency, vaginal bleeding and vaginal pain.  Musculoskeletal: Negative for back pain, joint swelling and myalgias.  Skin: Negative for rash.  Neurological: Negative for dizziness, syncope, light-headedness, numbness and headaches.  Hematological: Negative for adenopathy.  Psychiatric/Behavioral: Negative for agitation, confusion, sleep disturbance and suicidal ideas. The patient  is not nervous/anxious.    BREAST: No symptoms   Objective: BP 110/80   Ht 6' (1.829 m)   Wt 184 lb (83.5 kg)   BMI 24.95 kg/m    Physical Exam Constitutional:      Appearance: She is well-developed.  Genitourinary:     Vulva normal.     Right Labia: No rash, tenderness or lesions.    Left Labia: No tenderness, lesions or rash.    No vaginal discharge, erythema or tenderness.      Right Adnexa: not tender and no mass present.    Left Adnexa: not tender and no mass present.    No cervical friability or polyp.     Uterus is not enlarged or tender.  Breasts:     Right: No mass, nipple discharge, skin change or tenderness.     Left: No mass, nipple discharge, skin change or tenderness.    Neck:     Thyroid: No thyromegaly.  Cardiovascular:     Rate and Rhythm: Normal rate and regular rhythm.     Heart sounds: Normal heart sounds. No murmur heard.   Pulmonary:     Effort: Pulmonary effort is normal.     Breath sounds: Normal breath sounds.  Abdominal:     Palpations: Abdomen is soft.     Tenderness: There is no abdominal tenderness. There is no guarding or rebound.  Musculoskeletal:        General: Normal range of motion.     Cervical back: Normal range of motion.  Lymphadenopathy:     Cervical: No cervical adenopathy.  Neurological:     General: No focal deficit present.     Mental Status: She is alert and oriented to person, place, and time.     Cranial Nerves: No cranial nerve deficit.  Skin:    General: Skin is warm and dry.  Psychiatric:        Mood and Affect: Mood normal.        Behavior: Behavior normal.        Thought Content: Thought content normal.        Judgment: Judgment normal.  Vitals reviewed.     Results: Results for orders placed or performed in visit on 08/09/20 (from the past 24 hour(s))  POCT Wet Prep with KOH     Status: Abnormal   Collection Time: 08/09/20  9:27 AM  Result Value Ref Range   Trichomonas, UA Negative    Clue Cells  Wet Prep HPF POC pos    Epithelial Wet Prep HPF POC     Yeast Wet Prep HPF POC neg    Bacteria Wet Prep HPF POC     RBC Wet Prep HPF POC     WBC Wet Prep HPF POC     KOH Prep POC Positive (A) Negative    Assessment/Plan: Encounter for annual routine gynecological examination  Screening for STD (sexually transmitted disease) - Plan: Cervicovaginal ancillary only  Encounter  for surveillance of contraceptive pills - Plan: drospirenone-ethinyl estradiol (YASMIN) 3-0.03 MG tablet; OCP RF cont dosing  Bacterial vaginosis - Plan: POCT Wet Prep with KOH, metroNIDAZOLE (METROGEL) 0.75 % vaginal gel; pos sx and wet prep. Rx metrogel. Will RF prn sx. No EtOH.  Meds ordered this encounter  Medications  . metroNIDAZOLE (METROGEL) 0.75 % vaginal gel    Sig: Place 1 Applicatorful vaginally at bedtime for 5 days.    Dispense:  50 g    Refill:  0    Order Specific Question:   Supervising Provider    Answer:   Gae Dry [id number]  . drospirenone-ethinyl estradiol (YASMIN) 3-0.03 MG tablet    Sig: Take 1 tablet by mouth daily. CONTINUOUS DOSING    Dispense:  84 tablet    Refill:  3    Order Specific Question:   Supervising Provider    Answer:   Gae Dry [[id number]]             GYN counsel adequate intake of calcium and vitamin D, diet and exercise     F/U  Return in about 1 year (around 08/09/2021).  Maylynn Orzechowski B. Carrell Rahmani, PA-C 08/09/2020 9:28 AM

## 2020-08-09 ENCOUNTER — Encounter: Payer: Self-pay | Admitting: Obstetrics and Gynecology

## 2020-08-09 ENCOUNTER — Other Ambulatory Visit: Payer: Self-pay

## 2020-08-09 ENCOUNTER — Ambulatory Visit (INDEPENDENT_AMBULATORY_CARE_PROVIDER_SITE_OTHER): Payer: Commercial Managed Care - PPO | Admitting: Obstetrics and Gynecology

## 2020-08-09 ENCOUNTER — Other Ambulatory Visit (HOSPITAL_COMMUNITY)
Admission: RE | Admit: 2020-08-09 | Discharge: 2020-08-09 | Disposition: A | Discharge status: Home / Self Care | Payer: Commercial Managed Care - PPO | Source: Ambulatory Visit | Attending: Obstetrics and Gynecology | Admitting: Obstetrics and Gynecology

## 2020-08-09 VITALS — BP 110/80 | Ht 72.0 in | Wt 184.0 lb

## 2020-08-09 DIAGNOSIS — Z113 Encounter for screening for infections with a predominantly sexual mode of transmission: Secondary | ICD-10-CM | POA: Diagnosis present

## 2020-08-09 DIAGNOSIS — N760 Acute vaginitis: Secondary | ICD-10-CM | POA: Insufficient documentation

## 2020-08-09 DIAGNOSIS — Z3041 Encounter for surveillance of contraceptive pills: Secondary | ICD-10-CM | POA: Diagnosis not present

## 2020-08-09 DIAGNOSIS — B9689 Other specified bacterial agents as the cause of diseases classified elsewhere: Secondary | ICD-10-CM

## 2020-08-09 DIAGNOSIS — Z01419 Encounter for gynecological examination (general) (routine) without abnormal findings: Secondary | ICD-10-CM | POA: Diagnosis not present

## 2020-08-09 LAB — POCT WET PREP WITH KOH
Clue Cells Wet Prep HPF POC: POSITIVE
KOH Prep POC: POSITIVE — AB
Trichomonas, UA: NEGATIVE
Yeast Wet Prep HPF POC: NEGATIVE

## 2020-08-09 MED ORDER — DROSPIRENONE-ETHINYL ESTRADIOL 3-0.03 MG PO TABS: 1.0000 | ORAL_TABLET | Freq: Every day | ORAL | 3 refills | Status: DC

## 2020-08-09 MED ORDER — METRONIDAZOLE 0.75 % VA GEL: 1.0000 | Freq: Every day | VAGINAL | 0 refills | Status: DC

## 2020-08-10 LAB — CERVICOVAGINAL ANCILLARY ONLY
Chlamydia: NEGATIVE
Comment: NEGATIVE
Comment: NORMAL
Neisseria Gonorrhea: NEGATIVE

## 2020-08-25 ENCOUNTER — Ambulatory Visit: Payer: Commercial Managed Care - PPO | Admitting: Obstetrics and Gynecology

## 2020-09-16 ENCOUNTER — Telehealth: Payer: Self-pay

## 2020-09-16 NOTE — Telephone Encounter (Signed)
Pt calling; BV sxs are back; can rx be sent in?  Also needs labs explained to her.  [phone]

## 2020-09-19 ENCOUNTER — Other Ambulatory Visit: Payer: Self-pay | Admitting: Obstetrics and Gynecology

## 2020-09-19 ENCOUNTER — Telehealth: Payer: Self-pay

## 2020-09-19 DIAGNOSIS — N760 Acute vaginitis: Secondary | ICD-10-CM

## 2020-09-19 MED ORDER — METRONIDAZOLE 0.75 % VA GEL: 1.0000 | Freq: Every day | VAGINAL | 0 refills | Status: DC

## 2020-09-19 NOTE — Telephone Encounter (Signed)
Pt aware.

## 2020-09-19 NOTE — Telephone Encounter (Signed)
Pt calling; believes BV is back; can she have a refill or change to a pill?  [phone]

## 2020-09-19 NOTE — Telephone Encounter (Signed)
Rx RF eRxd.  

## 2020-09-19 NOTE — Progress Notes (Signed)
Rx RF metrogel for recurrent BV

## 2020-10-06 ENCOUNTER — Telehealth: Payer: Self-pay

## 2020-10-06 NOTE — Telephone Encounter (Signed)
Pt states she has a bruise on her left breast she's not sure how it got there and how long it's been there. Patient states its not painful. I advised the patient that I would give you this message. Also that she may want to call her POC because there may be some labs that they may want to do to follow up on the bruising. But if you wanted her that someone would call her back to set up appointment.

## 2020-10-06 NOTE — Telephone Encounter (Signed)
Pt to give time for bruise to go away like normal bruises. If persists over 2 wks, RTO for breast exam. Reassurance.

## 2020-10-06 NOTE — Telephone Encounter (Signed)
Pt aware.

## 2020-12-10 IMAGING — US ULTRASOUND RIGHT BREAST LIMITED
1 series · 8 of 8 positions shown · non-contrast
Comparison: None.

CLINICAL DATA: 27-year-old with personal history of fibroadenoma
excision from the LEFT breast, presenting with a new palpable lump
involving the LOWER periareolar RIGHT breast.

EXAM:
ULTRASOUND OF THE RIGHT BREAST

[Series 1: ultrasound right breast limited · 0.05mm/px · 8 of 8 slices shown]
[im 1/8]
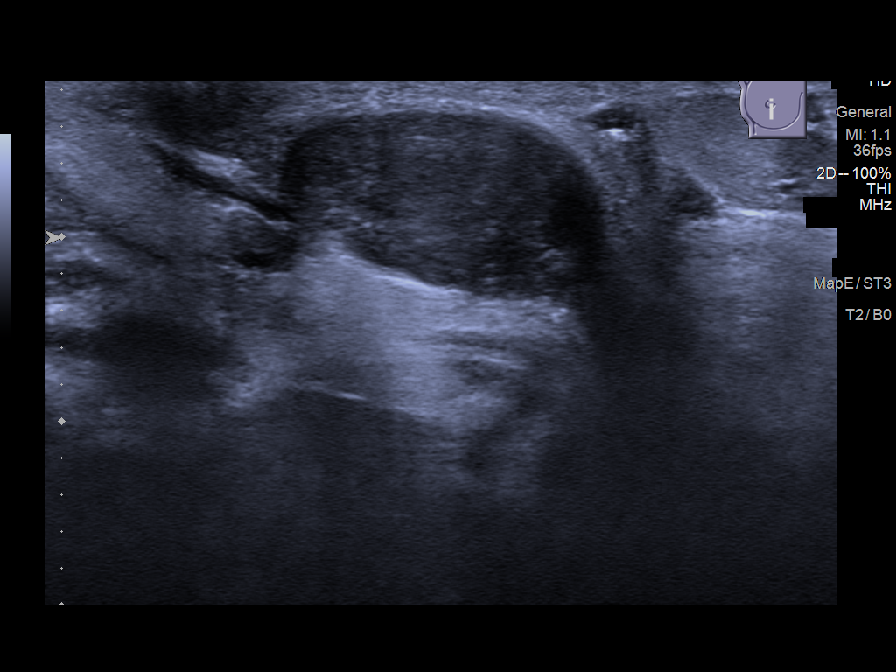
[im 2/8]
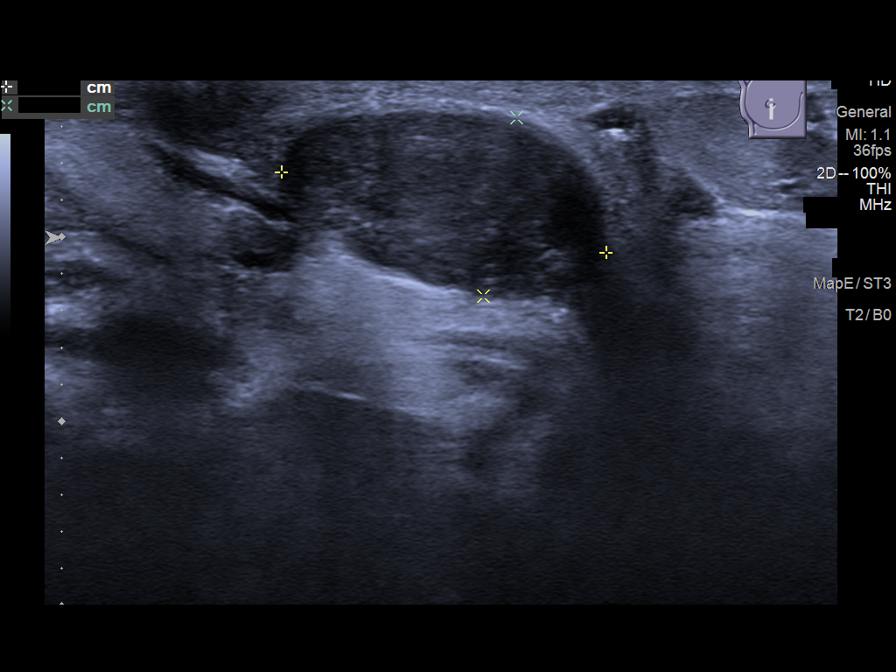
[im 3/8]
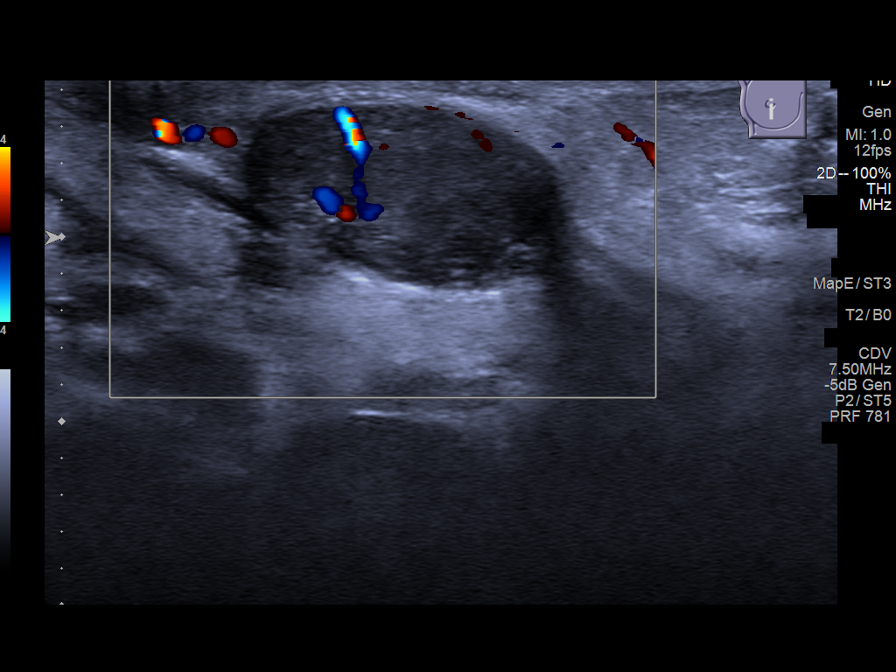
[im 4/8]
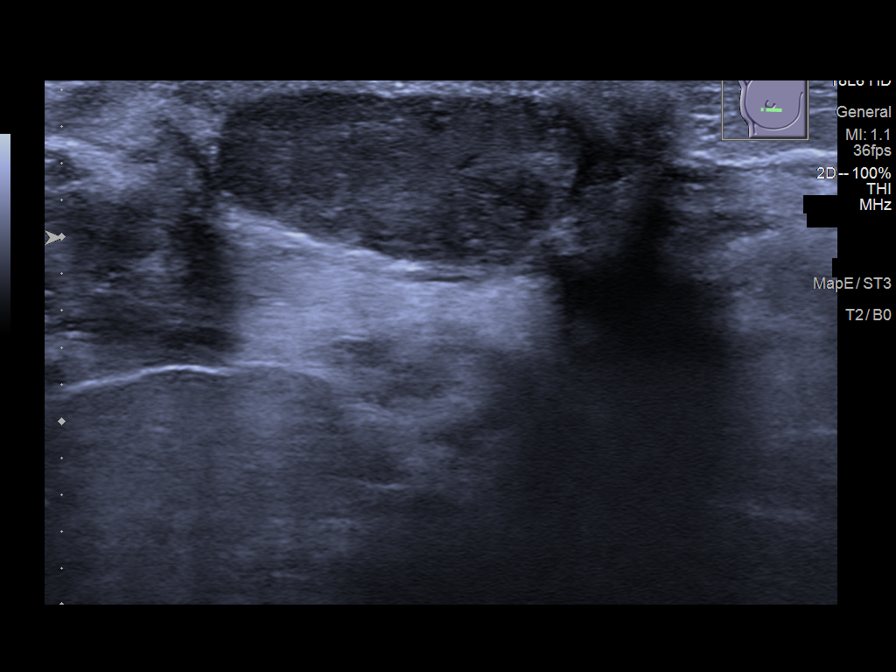
[im 5/8]
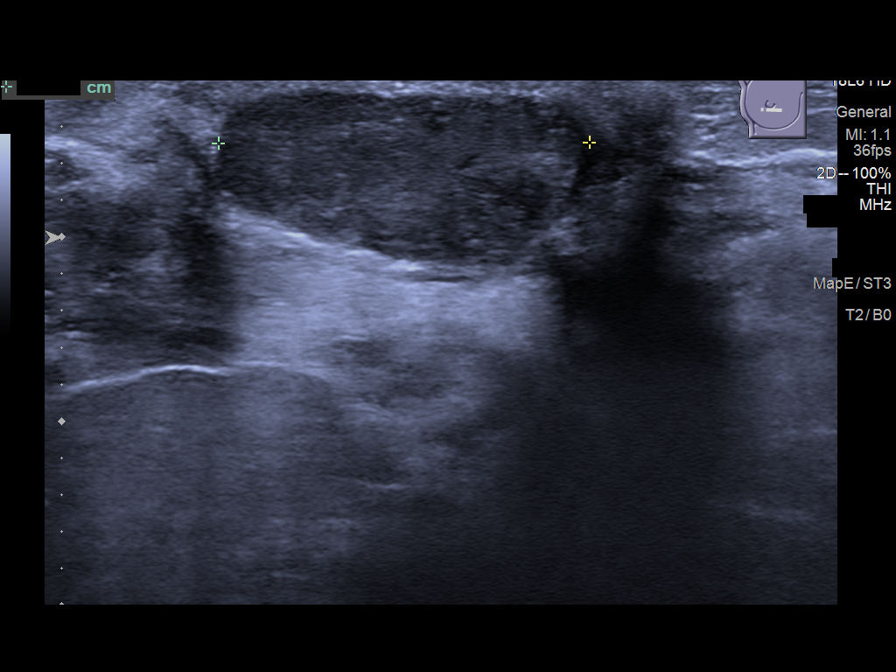
[im 6/8]
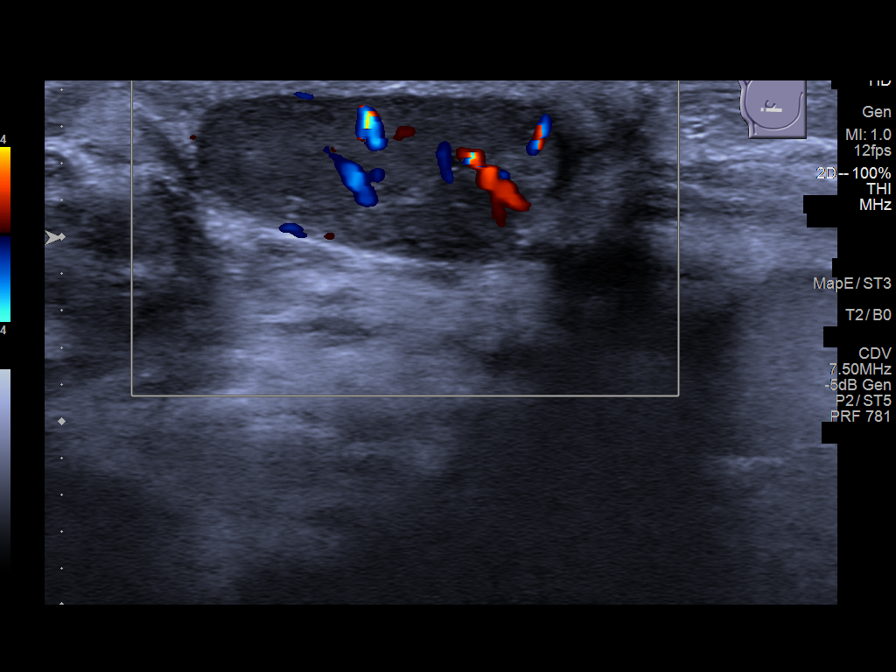
[im 7/8]
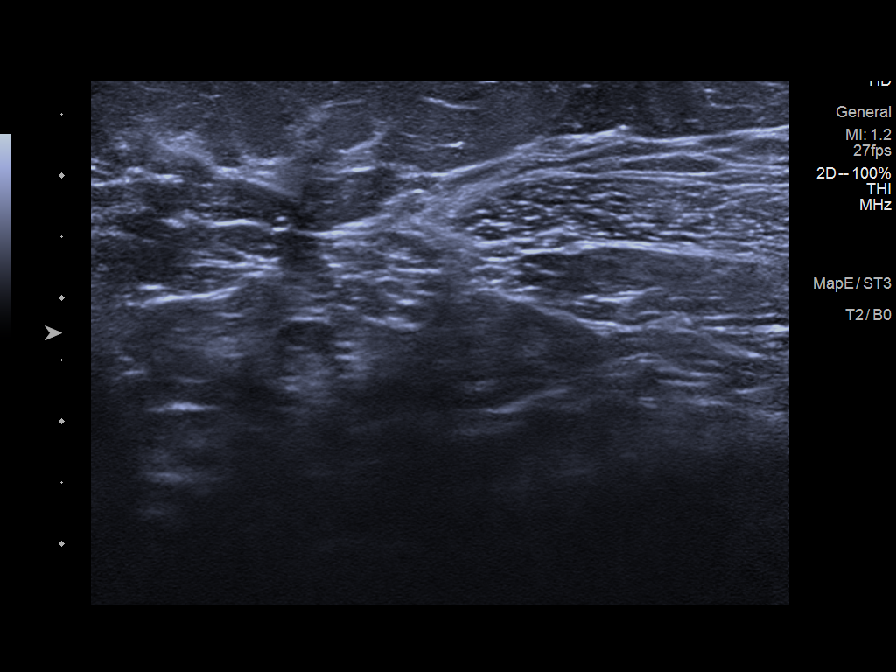
[im 8/8]
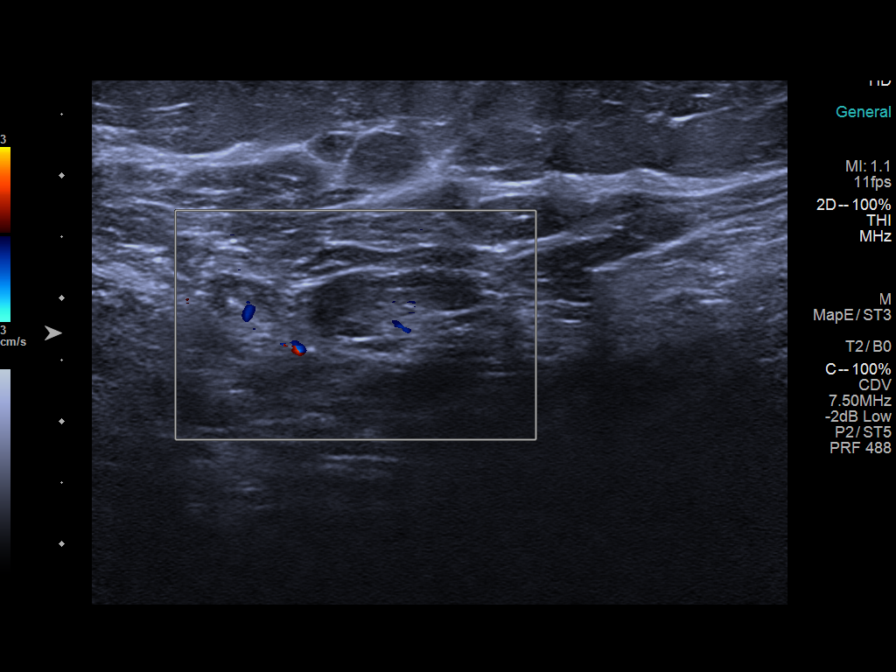

[8 of 8 positions shown; findings below may reference images not displayed]

FINDINGS: On physical exam, there is a mobile palpable approximate 2 cm lump
in the LOWER periareolar RIGHT breast.

Targeted RIGHT breast ultrasound is performed, showing an oval
circumscribed parallel hypoechoic mass at the 6 o'clock location
approximately 1 cm from the nipple measuring approximately 1.0 x
x 2.0 cm, demonstrating posterior acoustic enhancement and
demonstrating internal color Doppler flow, corresponding to the
palpable concern.

Sonographic evaluation of the RIGHT axilla demonstrates no
pathologic lymphadenopathy.
IMPRESSION: 1. Likely benign approximate 2 cm fibroadenoma involving the LOWER
periareolar RIGHT breast accounting for the palpable concern.
2. No pathologic RIGHT axillary lymphadenopathy.

RECOMMENDATION:
We discussed management options for the likely benign fibroadenoma
including excision, ultrasound-guided core biopsy, and short term
interval follow-up. Follow-up ultrasound is recommended at 6, 12 and
24 months to assess stability. The patient agrees with this plan.

I have discussed the findings and recommendations with the patient.
If applicable, a reminder letter will be sent to the patient
regarding the next appointment.

BI-RADS CATEGORY  3: Probably benign.

## 2021-02-06 ENCOUNTER — Other Ambulatory Visit (HOSPITAL_COMMUNITY)
Admission: RE | Admit: 2021-02-06 | Discharge: 2021-02-06 | Disposition: A | Discharge status: Home / Self Care | Payer: Commercial Managed Care - PPO | Source: Ambulatory Visit | Attending: Obstetrics and Gynecology | Admitting: Obstetrics and Gynecology

## 2021-02-06 ENCOUNTER — Other Ambulatory Visit: Payer: Self-pay

## 2021-02-06 ENCOUNTER — Ambulatory Visit: Payer: Commercial Managed Care - PPO | Admitting: Obstetrics and Gynecology

## 2021-02-06 ENCOUNTER — Encounter: Payer: Self-pay | Admitting: Obstetrics and Gynecology

## 2021-02-06 VITALS — BP 100/60 | Ht 72.0 in | Wt 188.0 lb

## 2021-02-06 DIAGNOSIS — Z202 Contact with and (suspected) exposure to infections with a predominantly sexual mode of transmission: Secondary | ICD-10-CM

## 2021-02-06 DIAGNOSIS — D500 Iron deficiency anemia secondary to blood loss (chronic): Secondary | ICD-10-CM | POA: Diagnosis not present

## 2021-02-06 DIAGNOSIS — Z113 Encounter for screening for infections with a predominantly sexual mode of transmission: Secondary | ICD-10-CM

## 2021-02-06 NOTE — Patient Instructions (Signed)
I value your feedback and you entrusting us with your care. If you get a [facility] patient survey, I would appreciate you taking the time to let us know about your experience today. Thank you! ? ? ?

## 2021-02-06 NOTE — Progress Notes (Signed)
Patient, No Pcp Per (Inactive)   Chief Complaint  Patient presents with   STD testing     HPI:      [name] is a 30 y.o. G0P0000 whose LMP was No LMP recorded. (Menstrual status: Oral contraceptives)., presents today for STD testing. Ex partner notified pt today he has a penile d/c and is getting tested. Last sex encounter with him was a month ago. Using OCPs, no condoms. LMP 3 wks ago. Pt denies and vag or urin sx. No LBP, pelvic pain, fevers.  Neg STD testing 3/22 Hx of IDA on 8/21 labs, taking MVI with Fe, would like CBC today.  Past Medical History:  Diagnosis Date   Anemia 2019   Fibroadenoma of breast, left    Fibroadenoma of breast, right 2020   per u/s    Past Surgical History:  Procedure Laterality Date   BREAST BIOPSY      Family History  Problem Relation Age of Onset   Thyroid cancer Maternal Grandmother    Breast cancer Neg Hx    Ovarian cancer Neg Hx     Social History   Socioeconomic History   Marital status: Single    Spouse name: Not on file   Number of children: Not on file   Years of education: Not on file   Highest education level: Not on file  Occupational History   Not on file  Tobacco Use   Smoking status: Never   Smokeless tobacco: Never  Vaping Use   Vaping Use: Never used  Substance and Sexual Activity   Alcohol use: Yes   Drug use: No   Sexual activity: Yes    Birth control/protection: Pill  Other Topics Concern   Not on file  Social History Narrative   Not on file   Social Determinants of Health   Financial Resource Strain: Not on file  Food Insecurity: Not on file  Transportation Needs: Not on file  Physical Activity: Not on file  Stress: Not on file  Social Connections: Not on file  Intimate Partner Violence: Not on file    Outpatient Medications Prior to Visit  Medication Sig Dispense Refill   drospirenone-ethinyl estradiol (YASMIN) 3-0.03 MG tablet Take 1 tablet by mouth daily. CONTINUOUS DOSING 84  tablet 3   pantoprazole (PROTONIX) 40 MG tablet Take 40 mg by mouth every morning.     escitalopram (LEXAPRO) 20 MG tablet Take 20 mg by mouth daily. (Patient not taking: Reported on 02/06/2021)     No facility-administered medications prior to visit.      ROS:  Review of Systems  Constitutional:  Negative for fever.  Gastrointestinal:  Negative for blood in stool, constipation, diarrhea, nausea and vomiting.  Genitourinary:  Negative for dyspareunia, dysuria, flank pain, frequency, hematuria, urgency, vaginal bleeding, vaginal discharge and vaginal pain.  Musculoskeletal:  Negative for back pain.  Skin:  Negative for rash.  BREAST: No symptoms   OBJECTIVE:   Vitals:  BP 100/60   Ht 6' (1.829 m)   Wt 188 lb (85.3 kg)   BMI 25.50 kg/m   Physical Exam Vitals reviewed.  Constitutional:      Appearance: She is well-developed.  Pulmonary:     Effort: Pulmonary effort is normal.  Genitourinary:    General: Normal vulva.     Pubic Area: No rash.      Labia:        Right: No rash, tenderness or lesion.  Left: No rash, tenderness or lesion.      Vagina: Normal. No vaginal discharge, erythema or tenderness.     Cervix: Normal.     Uterus: Normal. Not enlarged and not tender.      Adnexa: Right adnexa normal and left adnexa normal.       Right: No mass or tenderness.         Left: No mass or tenderness.    Musculoskeletal:        General: Normal range of motion.     Cervical back: Normal range of motion.  Skin:    General: Skin is warm and dry.  Neurological:     General: No focal deficit present.     Mental Status: She is alert and oriented to person, place, and time.  Psychiatric:        Mood and Affect: Mood normal.        Behavior: Behavior normal.        Thought Content: Thought content normal.        Judgment: Judgment normal.    Assessment/Plan: Screening for STD (sexually transmitted disease) - Plan: HIV Antibody (routine testing w rflx), RPR,  Hepatitis C antibody, HSV 2 antibody, IgG; labs today. Will f/u with results.   STD exposure  Iron deficiency anemia due to chronic blood loss - Plan: CBC with Differential/Platelet; pt taking MVI with Fe.     Return if symptoms worsen or fail to improve.  Tashay Bozich B. Bently Wyss, PA-C 02/06/2021 4:48 PM

## 2021-02-06 NOTE — Addendum Note (Signed)
Addended by: Ardeth Perfect B on: [id number] 04:58 PM   Modules accepted: Orders

## 2021-02-07 LAB — CBC WITH DIFFERENTIAL/PLATELET
Basophils Absolute: 0.1 10*3/uL (ref 0.0–0.2)
Basos: 1 %
EOS (ABSOLUTE): 0.1 10*3/uL (ref 0.0–0.4)
Eos: 1 %
Hematocrit: 35.7 % (ref 34.0–46.6)
Hemoglobin: 11.1 g/dL (ref 11.1–15.9)
Immature Grans (Abs): 0 10*3/uL (ref 0.0–0.1)
Immature Granulocytes: 0 %
Lymphocytes Absolute: 2 10*3/uL (ref 0.7–3.1)
Lymphs: 24 %
MCH: 25.1 pg — ABNORMAL LOW (ref 26.6–33.0)
MCHC: 31.1 g/dL — ABNORMAL LOW (ref 31.5–35.7)
MCV: 81 fL (ref 79–97)
Monocytes Absolute: 0.5 10*3/uL (ref 0.1–0.9)
Monocytes: 5 %
Neutrophils Absolute: 5.7 10*3/uL (ref 1.4–7.0)
Neutrophils: 69 %
Platelets: 429 10*3/uL (ref 150–450)
RBC: 4.42 x10E6/uL (ref 3.77–5.28)
RDW: 15.9 % — ABNORMAL HIGH (ref 11.7–15.4)
WBC: 8.3 10*3/uL (ref 3.4–10.8)

## 2021-02-07 LAB — HEPATITIS C ANTIBODY: Hep C Virus Ab: <0.1 s/co ratio (ref 0.0–0.9)

## 2021-02-07 LAB — HIV ANTIBODY (ROUTINE TESTING W REFLEX): HIV Screen 4th Generation wRfx: NONREACTIVE

## 2021-02-07 LAB — HSV 2 ANTIBODY, IGG: HSV 2 IgG, Type Spec: <0.91 index (ref 0.00–0.90)

## 2021-02-07 LAB — RPR: RPR Ser Ql: NONREACTIVE

## 2021-02-08 LAB — CERVICOVAGINAL ANCILLARY ONLY
Chlamydia: NEGATIVE
Comment: NEGATIVE
Comment: NEGATIVE
Comment: NORMAL
Neisseria Gonorrhea: NEGATIVE
Trichomonas: NEGATIVE

## 2021-02-09 ENCOUNTER — Encounter: Payer: Self-pay | Admitting: Obstetrics and Gynecology

## 2021-02-09 ENCOUNTER — Telehealth: Payer: Self-pay

## 2021-02-09 NOTE — Telephone Encounter (Signed)
Pt calling for test results.  [phone]  Mailbox is full.

## 2021-02-09 NOTE — Telephone Encounter (Signed)
Pt aware of results 

## 2021-02-09 NOTE — Telephone Encounter (Signed)
Pt returned your call about test results.

## 2021-03-23 ENCOUNTER — Other Ambulatory Visit: Payer: Self-pay | Admitting: Obstetrics and Gynecology

## 2021-03-23 DIAGNOSIS — Z3041 Encounter for surveillance of contraceptive pills: Secondary | ICD-10-CM

## 2021-03-23 NOTE — Telephone Encounter (Signed)
Pls do PA for continuous dosing for menorrhagia with hx of iron deficiency anemia. Thx.

## 2021-03-29 NOTE — Telephone Encounter (Signed)
PA submitted yesterday, waiting on response.

## 2021-04-03 NOTE — Telephone Encounter (Signed)
Received fax this morning that claim had been denied due to PA forms not received. Called the PA department to ask about forms I faxed and I was told system was not showing anything about claim being denied. I was told insurance had paid [facility] on 03/30/21 for the medication so the Customer Service rep advised to call pt's local pharmacy since she did not know how to help me further. I called pt's pharmacy and spoke to [facility] and was told pt had picked up the medication and looked like insurance paid for it. No further issues at this point.

## 2021-04-04 ENCOUNTER — Other Ambulatory Visit: Payer: Self-pay | Admitting: Obstetrics and Gynecology

## 2021-04-04 DIAGNOSIS — Z3041 Encounter for surveillance of contraceptive pills: Secondary | ICD-10-CM

## 2021-04-04 MED ORDER — DROSPIRENONE-ETHINYL ESTRADIOL 3-0.03 MG PO TABS: ORAL_TABLET | ORAL | 1 refills | Status: DC

## 2021-06-27 ENCOUNTER — Encounter: Payer: Self-pay | Admitting: Obstetrics and Gynecology

## 2021-06-28 ENCOUNTER — Other Ambulatory Visit: Payer: Self-pay | Admitting: Obstetrics and Gynecology

## 2021-06-28 DIAGNOSIS — B9689 Other specified bacterial agents as the cause of diseases classified elsewhere: Secondary | ICD-10-CM

## 2021-06-28 DIAGNOSIS — N760 Acute vaginitis: Secondary | ICD-10-CM

## 2021-06-28 MED ORDER — METRONIDAZOLE 0.75 % VA GEL: 1.0000 | Freq: Every day | VAGINAL | 0 refills | Status: AC

## 2021-06-28 NOTE — Progress Notes (Signed)
Rx RF metrogel for BV sx. 

## 2021-09-22 ENCOUNTER — Other Ambulatory Visit: Payer: Self-pay | Admitting: Obstetrics and Gynecology

## 2021-09-22 DIAGNOSIS — Z3041 Encounter for surveillance of contraceptive pills: Secondary | ICD-10-CM

## 2021-09-25 ENCOUNTER — Telehealth: Payer: Self-pay

## 2021-09-25 ENCOUNTER — Other Ambulatory Visit: Payer: Self-pay | Admitting: Obstetrics and Gynecology

## 2021-09-25 DIAGNOSIS — Z3041 Encounter for surveillance of contraceptive pills: Secondary | ICD-10-CM

## 2021-09-25 NOTE — Telephone Encounter (Signed)
Pt wanting a refill on her OCP's but over due for annual . Can you help get her scheduled please? Then I will send in one refill  ?

## 2021-09-29 ENCOUNTER — Other Ambulatory Visit: Payer: Self-pay | Admitting: Obstetrics and Gynecology

## 2021-09-29 ENCOUNTER — Encounter: Payer: Self-pay | Admitting: Obstetrics and Gynecology

## 2021-09-29 DIAGNOSIS — Z3041 Encounter for surveillance of contraceptive pills: Secondary | ICD-10-CM

## 2021-09-29 MED ORDER — DROSPIRENONE-ETHINYL ESTRADIOL 3-0.03 MG PO TABS: ORAL_TABLET | ORAL | 0 refills | Status: DC

## 2021-09-29 NOTE — Addendum Note (Signed)
Addended by: Drenda Freeze on: 09/29/2021 04:15 PM ? ? Modules accepted: Orders ? ?

## 2021-09-29 NOTE — Telephone Encounter (Signed)
Patient is scheduled for 11/12/21 with ABC

## 2021-10-23 ENCOUNTER — Encounter: Payer: Self-pay | Admitting: Obstetrics and Gynecology

## 2021-10-23 MED ORDER — METRONIDAZOLE 0.75 % VA GEL: 1.0000 | Freq: Every day | VAGINAL | 0 refills | Status: DC

## 2021-11-12 NOTE — Progress Notes (Deleted)
PCP:  Patient, No Pcp Per   No chief complaint on file.    HPI:      [name] is a 31 y.o. G0P0000 whose LMP was No LMP recorded. (Menstrual status: Oral contraceptives)., presents today for her annual examination.  Her menses are Q2-3 months with cont dosing OCPs, lasting 5 days.  Dysmenorrhea mild to mod, improved somewhat with NSAIDs. She does not have intermenstrual bleeding anymore.   Sex activity: single partner, contraception - OCP (estrogen/progesterone).  Last Pap: 09/04/18  Results were: no abnormalities  Hx of STDs: none; wants STD testing today.  Has had fishy odor for a couple wks. Started with increased d/c and irritation that resolved. No meds to treat. Hx of BV in past.   There is no FH of breast cancer. There is no FH of ovarian cancer. The patient does do self-breast exams. Hx of stable RT breast fibroadenoma. Would like breast reduction, plans to see plastic surgeon.  Tobacco use: The patient denies current or previous tobacco use. Alcohol use: none No drug use.  Exercise: not active  She does get adequate calcium but not Vitamin D in her diet.   Past Medical History:  Diagnosis Date   Anemia 2019   Fibroadenoma of breast, left    Fibroadenoma of breast, right 2020   per u/s    Past Surgical History:  Procedure Laterality Date   BREAST BIOPSY      Family History  Problem Relation Age of Onset   Thyroid cancer Maternal Grandmother    Breast cancer Neg Hx    Ovarian cancer Neg Hx     Social History   Socioeconomic History   Marital status: Single    Spouse name: Not on file   Number of children: Not on file   Years of education: Not on file   Highest education level: Not on file  Occupational History   Not on file  Tobacco Use   Smoking status: Never   Smokeless tobacco: Never  Vaping Use   Vaping Use: Never used  Substance and Sexual Activity   Alcohol use: Yes   Drug use: No   Sexual activity: Yes    Birth  control/protection: Pill  Other Topics Concern   Not on file  Social History Narrative   Not on file   Social Determinants of Health   Financial Resource Strain: Not on file  Food Insecurity: Not on file  Transportation Needs: Not on file  Physical Activity: Not on file  Stress: Not on file  Social Connections: Not on file  Intimate Partner Violence: Not on file     Current Outpatient Medications:    drospirenone-ethinyl estradiol (SYEDA) 3-0.03 MG tablet, TAKE 1 TABLET BY MOUTH DAILY. CONTINUOUS DOSING, Disp: 84 tablet, Rfl: 0   escitalopram (LEXAPRO) 20 MG tablet, Take 20 mg by mouth daily. (Patient not taking: Reported on 02/06/2021), Disp: , Rfl:    pantoprazole (PROTONIX) 40 MG tablet, Take 40 mg by mouth every morning., Disp: , Rfl:      ROS:  Review of Systems  Constitutional:  Negative for fatigue, fever and unexpected weight change.  Respiratory:  Negative for cough, shortness of breath and wheezing.   Cardiovascular:  Negative for chest pain, palpitations and leg swelling.  Gastrointestinal:  Negative for blood in stool, constipation, diarrhea, nausea and vomiting.  Endocrine: Negative for cold intolerance, heat intolerance and polyuria.  Genitourinary:  Positive for vaginal discharge. Negative for dyspareunia, dysuria, flank pain, frequency,  genital sores, hematuria, menstrual problem, pelvic pain, urgency, vaginal bleeding and vaginal pain.  Musculoskeletal:  Negative for back pain, joint swelling and myalgias.  Skin:  Negative for rash.  Neurological:  Negative for dizziness, syncope, light-headedness, numbness and headaches.  Hematological:  Negative for adenopathy.  Psychiatric/Behavioral:  Negative for agitation, confusion, sleep disturbance and suicidal ideas. The patient is not nervous/anxious.    BREAST: No symptoms   Objective: There were no vitals taken for this visit.   Physical Exam Constitutional:      Appearance: She is well-developed.   Genitourinary:     Vulva normal.     Right Labia: No rash, tenderness or lesions.    Left Labia: No tenderness, lesions or rash.    No vaginal discharge, erythema or tenderness.      Right Adnexa: not tender and no mass present.    Left Adnexa: not tender and no mass present.    No cervical friability or polyp.     Uterus is not enlarged or tender.  Breasts:    Right: No mass, nipple discharge, skin change or tenderness.     Left: No mass, nipple discharge, skin change or tenderness.  Neck:     Thyroid: No thyromegaly.  Cardiovascular:     Rate and Rhythm: Normal rate and regular rhythm.     Heart sounds: Normal heart sounds. No murmur heard. Pulmonary:     Effort: Pulmonary effort is normal.     Breath sounds: Normal breath sounds.  Abdominal:     Palpations: Abdomen is soft.     Tenderness: There is no abdominal tenderness. There is no guarding or rebound.  Musculoskeletal:        General: Normal range of motion.     Cervical back: Normal range of motion.  Lymphadenopathy:     Cervical: No cervical adenopathy.  Neurological:     General: No focal deficit present.     Mental Status: She is alert and oriented to person, place, and time.     Cranial Nerves: No cranial nerve deficit.  Skin:    General: Skin is warm and dry.  Psychiatric:        Mood and Affect: Mood normal.        Behavior: Behavior normal.        Thought Content: Thought content normal.        Judgment: Judgment normal.  Vitals reviewed.     Results: No results found for this or any previous visit (from the past 24 hour(s)).   Assessment/Plan: Encounter for annual routine gynecological examination  Screening for STD (sexually transmitted disease) - Plan: Cervicovaginal ancillary only  Encounter for surveillance of contraceptive pills - Plan: drospirenone-ethinyl estradiol (YASMIN) 3-0.03 MG tablet; OCP RF cont dosing  Bacterial vaginosis - Plan: POCT Wet Prep with KOH, metroNIDAZOLE (METROGEL)  0.75 % vaginal gel; pos sx and wet prep. Rx metrogel. Will RF prn sx. No EtOH.  No orders of the defined types were placed in this encounter.            GYN counsel adequate intake of calcium and vitamin D, diet and exercise     F/U  No follow-ups on file.  Nannie Starzyk B. Satara Virella, PA-C 11/12/2021 4:05 PM

## 2021-11-14 ENCOUNTER — Ambulatory Visit: Payer: Commercial Managed Care - PPO | Admitting: Obstetrics and Gynecology

## 2021-11-14 DIAGNOSIS — Z01419 Encounter for gynecological examination (general) (routine) without abnormal findings: Secondary | ICD-10-CM

## 2021-11-14 DIAGNOSIS — Z3041 Encounter for surveillance of contraceptive pills: Secondary | ICD-10-CM

## 2021-11-14 DIAGNOSIS — Z124 Encounter for screening for malignant neoplasm of cervix: Secondary | ICD-10-CM

## 2021-12-21 ENCOUNTER — Telehealth (HOSPITAL_COMMUNITY): Payer: Self-pay | Admitting: Licensed Clinical Social Worker

## 2021-12-21 ENCOUNTER — Encounter: Payer: Self-pay | Admitting: Psychiatry

## 2021-12-21 ENCOUNTER — Ambulatory Visit (INDEPENDENT_AMBULATORY_CARE_PROVIDER_SITE_OTHER): Payer: 59 | Admitting: Psychiatry

## 2021-12-21 VITALS — BP 129/94 | HR 123 | Temp 97.9°F | Ht 72.0 in | Wt 181.2 lb

## 2021-12-21 DIAGNOSIS — F509 Eating disorder, unspecified: Secondary | ICD-10-CM

## 2021-12-21 DIAGNOSIS — F1020 Alcohol dependence, uncomplicated: Secondary | ICD-10-CM | POA: Diagnosis not present

## 2021-12-21 DIAGNOSIS — F419 Anxiety disorder, unspecified: Secondary | ICD-10-CM

## 2021-12-21 DIAGNOSIS — R69 Illness, unspecified: Secondary | ICD-10-CM | POA: Diagnosis not present

## 2021-12-21 DIAGNOSIS — F319 Bipolar disorder, unspecified: Secondary | ICD-10-CM

## 2021-12-21 DIAGNOSIS — Z8659 Personal history of other mental and behavioral disorders: Secondary | ICD-10-CM | POA: Insufficient documentation

## 2021-12-21 DIAGNOSIS — F32A Depression, unspecified: Secondary | ICD-10-CM | POA: Insufficient documentation

## 2021-12-21 NOTE — Patient Instructions (Addendum)
ADS- Alcohol and drug services   Call toll free at [place], [place], [place] [address] [phone]    [email or web address] [place], [place], [place] [address]  [phone]   [facility]  [address]  [place], [place] [address]  Phone: [phone]    Alcohol Use Disorder Alcohol use disorder is a condition in which drinking disrupts daily life. People with this condition drink too much alcohol and cannot control their drinking. Alcohol use disorder can cause serious problems with physical health. It can affect the brain, heart, and other internal organs. This disorder can raise the risk for certain cancers and cause problems with mental health, such as depression or anxiety. What are the causes? This condition is caused by drinking too much alcohol over time. Some people with this condition drink to cope with or escape from negative life events. Others drink to relieve symptoms of physical pain or symptoms of mental illness. What increases the risk? You are more likely to develop this condition if: You have a family history of alcohol use disorder. Your culture encourages drinking to the point of becoming drunk (intoxication). You had a mood or conduct disorder in childhood. You have been abused. You are an adolescent and you: Have poor performance in school. Have poor supervision or guidance. Act on impulse and like taking risks. What are the signs or symptoms? Symptoms of this condition include: Drinking more than you want to. Trying several times without success to drink less. Spending a lot of time thinking about alcohol, getting alcohol, drinking alcohol, or recovering from drinking alcohol. Continuing to drink even when it is causing serious problems in your daily life. Drinking when it is dangerous to drink, such as before  driving a car. Needing more and more alcohol to get the same effect you want (building up tolerance). Having symptoms of withdrawal when you stop drinking. Withdrawal symptoms may include: Trouble sleeping, leading to tiredness (fatigue). Mood swings of depression and anxiety. Physical symptoms, such as a fast heart rate, rapid breathing, high blood pressure (hypertension), fever, cold sweats, or nausea. Seizures. Severe confusion. Feeling or seeing things that are not there (hallucinations). Shaking movements that you cannot control (tremors). How is this diagnosed? This condition is diagnosed with an assessment. Your health care provider may start by asking three or four questions about your drinking, or they may give you a simple test to take. This helps to get clear information from you. You may also have a physical exam or lab tests. You may be referred to a substance abuse counselor. How is this treated? With education, some people with alcohol use disorder are able to reduce their drinking. Many with this disorder cannot change their drinking behavior on their own and need help with treatment from substance use specialists. Treatments may include: Detoxification. Detoxification involves quitting drinking with supervision and direction of health care providers. Your health care provider may prescribe medicines within the first week to help lessen withdrawal symptoms. Alcohol withdrawal can be dangerous and life-threatening. Detoxification may be provided in a home, community, or primary care setting, or in a hospital or substance use treatment facility. Counseling. This may involve motivational interviewing (MI), family therapy, or cognitive behavioral therapy (CBT). A counselor can address the things you can do to change your drinking behavior and how to maintain the changes. Talk therapy aims to: Identify your positive  motivations to change. Identify and avoid the things that trigger your  drinking. Help you learn how to plan your behavior change. Develop support systems that can help you sustain the change. Medicines. Medicines can help treat this disorder by: Decreasing cravings. Decreasing the positive feeling you have when you drink. Causing an uncomfortable physical reaction when you drink (aversion therapy). Support groups such as Alcoholics Anonymous (AA). These groups are led by people who have quit drinking. The groups provide emotional support, advice, and guidance. Some people with this condition benefit from a combination of treatments provided by specialized substance use treatment centers. Follow these instructions at home:  Medicines Take over-the-counter and prescription medicines only as told by your health care provider. Ask before starting any new medicines, herbs, or supplements. General instructions Ask friends and family members to support your choice to stay sober. Avoid places where alcohol is served. Create a plan to deal with tempting situations. Attend support groups regularly. Practice hobbies or activities you enjoy. Do not drink and drive. How is this prevented? Do not drink alcohol if your health care provider tells you not to drink. If you drink alcohol: Limit how much you have to: 0-1 drink a day for women who are not pregnant. 0-2 drinks a day for men. Know how much alcohol is in your drink. In the [place] one drink equals one 12 oz bottle of beer (355 mL), one 5 oz glass of wine (148 mL), or one 1 oz glass of hard liquor (44 mL). If you have a mental health condition, seek treatment. Develop a healthy lifestyle through: Meditation or deep breathing. Exercise. Spending time in nature. Listening to music. Talking with a trusted friend or family member. If you are a teen: Do not drink alcohol. Avoid gatherings where you might be tempted to drink alcohol. Do not be afraid to say no if someone offers you alcohol. Speak up about why you  do not want to drink. Set a positive example for others around you by not drinking. Build relationships with friends who do not drink. Where to find more information Substance Abuse and Mental Health Services Administration: SamedayNews.com.cy Alcoholics Anonymous: ShedSizes.ch Contact a health care provider if: You cannot take your medicines as told. Your symptoms get worse or you experience symptoms of withdrawal when you stop drinking. You start drinking again (relapse) and your symptoms get worse. Get help right away if: You have thoughts about hurting yourself or others. Get help right away if you feel like you may hurt yourself or others, or have thoughts about taking your own life. Go to your nearest emergency room or: Call 911. Call the [facility] at 587-502-4049 or 988. This is open 24 hours a day. Text the Crisis Text Line at 737-833-8902. Summary Alcohol use disorder is a condition in which drinking disrupts daily life. People with this condition drink too much alcohol and cannot control their drinking. Treatment may include detoxification, counseling, medicines, and support groups. Ask friends and family members to support you. Avoid situations where alcohol is served. Get help right away if you have thoughts about hurting yourself or others. This information is not intended to replace advice given to you by your health care provider. Make sure you discuss any questions you have with your health care provider. Document Revised: 07/12/2021 Document Reviewed: 07/12/2021 Elsevier Patient Education  [place].

## 2021-12-21 NOTE — Telephone Encounter (Signed)
The therapist reaches out to [facility] after receiving a referral from Dr. Shea Evans.   She says that she was treated as a child for ADHD. She was on Ritalin and then Adderall as an adult but stopped Adderall as she hated how it made her feel. Her father was an alcoholic.   She says that she drank this morning and that she drinks every day all day unless at work; however, she was fired from her job at the end of April. She was sober for a month until about three weeks ago. When she does not drink, she will have tremors and get bad headaches and have trouble sleeping.   She has never been to treatment but has attended AA and has a Publishing copy with whom she recently got back in touch. She has [employer] saying that she is able to pay for this from money left by her father after he died.   The therapist discusses treatment options and the risks of stopping alcohol cold [place]. Heather Kaiser says that she believes that she will go inpatient as this is what her mother and sister have suggested and what she thinks she needs as well. The therapist makes her aware of [facility].  The therapist gives her his name and direct contact number. She is going to talk with her Sponsor and call this therapist back on a p.r.n. basis.   Adam Phenix, [place], LCSW, [facility], [place] 12/21/2021

## 2021-12-21 NOTE — Progress Notes (Signed)
Psychiatric Initial Adult Assessment   Patient Identification: Heather Kaiser MRN:  [id number] Date of Evaluation:  12/21/2021 Referral Source: Dallas Breeding PA - C Chief Complaint:   Chief Complaint  Patient presents with   Establish Care: 31 year old Caucasian female with history of anxiety, mood swings, alcoholism, attention and focus deficit, presented to establish care.   Visit Diagnosis:    ICD-10-CM   1. Anxiety disorder, unspecified type  F41.9 Urine drugs of abuse scrn w alc, routine (Ref Lab)   R/O substance induced ( Alcohol )     2. Alcohol use disorder, moderate, dependence (HCC)  F10.20 Urine drugs of abuse scrn w alc, routine (Ref Lab)    3. Bipolar and related disorder ([place])  F31.9 Urine drugs of abuse scrn w alc, routine (Ref Lab)   R/O substance induced ( Alcohol )     4. Eating disorder, unspecified type  F50.9    R/O Bulimia     5. History of ADHD  Z86.59       History of Present Illness:  Heather Kaiser ' Heather Kaiser " is a 31 year old Caucasian female who has a history of anxiety, mood swings, alcoholism, attention and concentration deficit, gastroesophageal reflux disease, scoliosis, was evaluated in office today.  Patient reports she has been struggling with anxiety symptoms since the past several years.  She calls herself a Research officer, trade [employer].  She worries about everything to the extreme is often nervous and anxious restless.  Patient reports she has tried several different SSRIs in the past however none of the medications worked for her.  Patient reports she also struggles with mood swings.  She goes through episodes of feeling extremely happy, spending sprees, high energy decreased need for sleep and then she will go through episodes of depression when she is sad, unable to do anything and struggles with lack of motivation.  Patient also reports a history of being diagnosed with ADHD as a child.  Reports she may have had testing done as a child however does not clearly remember.   Has tried medications like stimulants as well as nonstimulant medications for her ADHD including Adderall, Concerta, Ritalin.  However she reports she had appetite suppression as well as she felt like she had manic symptoms when she tried stimulants in the past.  She has is currently not on these medications.  Most recently was tried on Wellbutrin however she stopped taking it after a few months since she felt it was not helpful.  Patient reports she currently struggles with alcoholism.  She describes her first drink was at the age of 67.  Patient reports she used to drink socially up until 2020.  She reports she went through a break-up with her boyfriend in 2020 and started drinking alcohol heavily.  Patient reports on and off she would abuse high amount of alcohol-hard liquor, 1 bottle of vodka would last for 2 days.  Patient reports she often needs an eye opener in the morning to get going.  She describes having tremors and shakes after heavy drinking session.  Patient reports she had trouble functioning at work.  She was working at [employer]', and lost her job because of drinking on job.  Patient reports she tried to get into AA meetings however has not been very compliant with it.  Most recently she was sober for 3 weeks however started drinking back again.  Patient reports she is interested in a referral to [facility] program, would like to get treatment for her alcoholism.  Patient  denies any other substance abuse problems.  Patient does report episodes of bulimia, reports when she has situational stressors she goes into binging episodes on food and then has purging episodes.  She reports she can go without any problem for months and anything can trigger her binging episodes.  She has never been diagnosed with bulimia previously.  She has been in therapy previously however that did not work out well, had only 1 session since she could not afford it.  Patient does report a history of emotional trauma,  witnessed a lot of arguing between her alcoholic father and her sister who has mental health problems.  She reports she used to dissociate as a way of coping when she was in these situations.  However currently denies any PTSD symptoms.  Patient currently denies any suicidality, homicidality or perceptual disturbances.  Patient denies any other concerns today.   Associated Signs/Symptoms: Depression Symptoms:  depressed mood, anhedonia, insomnia, hypersomnia, fatigue, difficulty concentrating, anxiety, loss of energy/fatigue, increased appetite, (Hypo) Manic Symptoms:  Distractibility, Elevated Mood, Financial Extravagance, Impulsivity, Labiality of Mood, Anxiety Symptoms:  Excessive Worry, Psychotic Symptoms:   Denies PTSD Symptoms: Had a traumatic exposure:  as noted above  Past Psychiatric History: Patient denies being admitted to inpatient behavioral health hospitals in the past.  Patient does report a previous diagnosis of generalized anxiety, depression.  Patient does report trying to cut herself in the past when she went through a break-up with her boyfriend in 2020.  Patient denies any other suicide attempts or self-injurious behaviors.  Previous Psychotropic Medications: Yes Lexapro-made her now, Zoloft may have helped with depression but not with anxiety, Wellbutrin, Ritalin, Adderall, Concerta-stimulants caused appetite suppression and caused her to be manic.  Substance Abuse History in the last 12 months:  Yes.  As noted above-alcoholism-hard liquor-first drink at the age of 31.  Started heavy abuse 3 years ago after a break-up-1 bottle of vodka lasts for 2 - 2.5 days.  Has had tremors and shakes after heavy drinking session, she went to work after drinking and was let go recently.  Reports she had an alcoholic drink this morning around 10 AM since she wanted to feel better about coming to this appointment and was anxious.  Consequences of Substance Abuse: Medical  Consequences:  anxiety, depression, mood swings Withdrawal Symptoms:   Tremors Lost job due to alcoholism  Past Medical History:  Past Medical History:  Diagnosis Date   Anemia 2019   Anxiety    Fibroadenoma of breast, left    Fibroadenoma of breast, right 2020   per u/s   Scoliosis     Past Surgical History:  Procedure Laterality Date   BREAST BIOPSY      Family Psychiatric History: As noted below.  Family History:  Family History  Problem Relation Age of Onset   Depression Mother    Depression Father    Alcohol abuse Father    Mental illness Sister    Thyroid cancer Maternal Grandmother    Bipolar disorder Half-Sister    ADD / ADHD Half-Sister    Breast cancer Neg Hx    Ovarian cancer Neg Hx     Social History:   Social History   Socioeconomic History   Marital status: Single    Spouse name: Not on file   Number of children: Not on file   Years of education: Not on file   Highest education level: Some college, no degree  Occupational History   Not on file  Tobacco Use   Smoking status: Never   Smokeless tobacco: Never  Vaping Use   Vaping Use: Former  Substance and Sexual Activity   Alcohol use: Yes    Alcohol/week: 42.0 standard drinks of alcohol    Types: 42 Shots of liquor per week    Comment: heavy abuse - 1 bottle last 2-2.5 days   Drug use: No   Sexual activity: Yes    Birth control/protection: Pill  Other Topics Concern   Not on file  Social History Narrative   Not on file   Social Determinants of Health   Financial Resource Strain: Not on file  Food Insecurity: Not on file  Transportation Needs: Not on file  Physical Activity: Not on file  Stress: Not on file  Social Connections: Not on file    Additional Social History: Patient was raised by her mother and stepdad, she also spent every other week with her dad who had shared custody.  Patient has 3 half sisters.  Patient graduated high school and did 3 years of college.  Patient  currently single.  Lives in [place] with her parents, mother and stepdad.  Patient currently is unemployed, recently lost her job.  Allergies:  No Known Allergies  Metabolic Disorder Labs: No results found for: "HGBA1C", "MPG" No results found for: "PROLACTIN" No results found for: "CHOL", "TRIG", "HDL", "CHOLHDL", "VLDL", "LDLCALC" Lab Results  Component Value Date   TSH 2.430 12/21/2019    Therapeutic Level Labs: No results found for: "LITHIUM" No results found for: "CBMZ" No results found for: "VALPROATE"  Current Medications: Current Outpatient Medications  Medication Sig Dispense Refill   drospirenone-ethinyl estradiol (SYEDA) 3-0.03 MG tablet TAKE 1 TABLET BY MOUTH DAILY. CONTINUOUS DOSING 84 tablet 0   No current facility-administered medications for this visit.    Musculoskeletal: Strength & Muscle Tone: within normal limits Gait & Station: normal Patient leans: N/A  Psychiatric Specialty Exam: Review of Systems  Musculoskeletal:  Positive for back pain.  Psychiatric/Behavioral:  Positive for decreased concentration, dysphoric mood and sleep disturbance. The patient is nervous/anxious.   All other systems reviewed and are negative.   Blood pressure (!) 129/94, pulse (!) 123, temperature 97.9 F (36.6 C), temperature source Temporal, height 6' (1.829 m), weight 181 lb 3.2 oz (82.2 kg), last menstrual period 11/20/2021.Body mass index is 24.58 kg/m.  General Appearance: Casual  Eye Contact:  Fair  Speech:  Clear and Coherent  Volume:  Normal  Mood:  Anxious and Depressed  Affect:  Appropriate  Thought Process:  Goal Directed and Descriptions of Associations: Intact  Orientation:  Full (Time, Place, and Person)  Thought Content:  Logical  Suicidal Thoughts:  No  Homicidal Thoughts:  No  Memory:  Immediate;   Fair Recent;   Fair Remote;   Fair  Judgement:  Fair  Insight:  Fair  Psychomotor Activity:  Normal  Concentration:  Concentration: Fair and Attention  Span: Fair  Recall:  [place]: Fair  Akathisia:  No  Handed:  Right  AIMS (if indicated):  not done  Assets:  Communication Skills Desire for Improvement Housing Social Support Transportation  ADL's:  Intact  Cognition: WNL  Sleep:  Poor   Screenings: AUDIT    Flowsheet [place] Office Visit from 12/21/2021 in [facility]  Alcohol Use Disorder Identification Test Final Score (AUDIT) 31      [place] Office Visit from 12/21/2021 in [facility]  Total GAD-7 Score 20      PHQ2-9    [place] Visit from 12/21/2021 in [facility]  PHQ-2 Total Score 6  PHQ-9 Total Score 22      [place] Office Visit from 12/21/2021 in [facility] RISK CATEGORY Low Risk       Assessment and Plan:  Sagan Wurzel ' Heather Kaiser " is a 31 year old Caucasian female who has a history of anxiety, mood swings, alcoholism, attention and concentration deficit, gastroesophageal reflux disease, scoliosis, was evaluated in office today.  Patient currently with severe alcoholism, will benefit from referral to [facility] program or residential treatment program.  Discussed plan as noted below.  Plan Anxiety disorder unspecified-rule out substance-induced anxiety-unstable Patient will benefit from a treatment program for alcoholism prior to making medication changes.  Alcohol use disorder moderate--unstable Provided information for [facility] program-[place].  I have communicated with coordinator-Mr. Steele Sizer. Also provided information for [facility] in [place], [place]. Patient advised to reestablish care with AA. AUDIT completed. Scored very high.  Bipolar and related disorder-rule out substance-induced bipolar symptoms-unstable Patient will benefit from a treatment program for alcoholism.  Will refer her for the  same.  Eating disorder-rule out bulimia-unstable This needs to be explored further in future sessions.  Recommend referral for CBT.   History of ADHD-patient advised that will need to request medical records-previous testing report.  If not she will need to be referred for ADHD testing.  Will order a urine drug screen-provided lab slip.  Patient with elevated heart rate in session today-advised to monitor and follow up with her primary care provider.  Patient reports she was anxious coming into the office today which could have resulted in the same.  Follow-up in clinic after completing substance abuse treatment program.  This note was generated in part or whole with voice recognition software. Voice recognition is usually quite accurate but there are transcription errors that can and very often do occur. I apologize for any typographical errors that were not detected and corrected.       Ursula Alert, MD 8/4/20238:02 AM

## 2022-01-02 ENCOUNTER — Ambulatory Visit: Payer: Commercial Managed Care - PPO | Admitting: Obstetrics and Gynecology

## 2022-01-03 NOTE — Progress Notes (Unsigned)
PCP:  Patient, No Pcp Per   No chief complaint on file.    HPI:      [name] is a 31 y.o. G0P0000 whose LMP was Patient's last menstrual period was 11/20/2021 (approximate)., presents today for her annual examination.  Her menses are Q2-3 months with cont dosing OCPs, lasting 5 days.  Dysmenorrhea mild to mod, improved somewhat with NSAIDs. She does not have intermenstrual bleeding anymore.   Sex activity: single partner, contraception - OCP (estrogen/progesterone).  Last Pap: 09/04/18  Results were: no abnormalities  Hx of STDs: none; wants STD testing today.  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 Started with increased d/c and irritation that resolved. No meds to treat. Hx of BV in past.   There is no FH of breast cancer. There is no FH of ovarian cancer. The patient does do self-breast exams. Hx of stable RT breast fibroadenoma. Would like breast reduction, plans to see plastic surgeon.  Tobacco use: The patient denies current or previous tobacco use. Alcohol use: none No drug use.  Exercise: not active  She does get adequate calcium but not Vitamin D in her diet.   Past Medical History:  Diagnosis Date   Anemia 2019   Anxiety    Fibroadenoma of breast, left    Fibroadenoma of breast, right 2020   per u/s   Scoliosis     Past Surgical History:  Procedure Laterality Date   BREAST BIOPSY      Family History  Problem Relation Age of Onset   Depression Mother    Depression Father    Alcohol abuse Father    Mental illness Sister    Thyroid cancer Maternal Grandmother    Bipolar disorder Half-Sister    ADD / ADHD Half-Sister    Breast cancer Neg Hx    Ovarian cancer Neg Hx     Social History   Socioeconomic History   Marital status: Single    Spouse name: Not on file   Number of children: Not on file   Years of education: Not on file   Highest education level: Some college, no degree  Occupational History    Not on file  Tobacco Use   Smoking status: Never   Smokeless tobacco: Never  Vaping Use   Vaping Use: Former  Substance and Sexual Activity   Alcohol use: Yes    Alcohol/week: 42.0 standard drinks of alcohol    Types: 42 Shots of liquor per week    Comment: heavy abuse - 1 bottle last 2-2.5 days   Drug use: No   Sexual activity: Yes    Birth control/protection: Pill  Other Topics Concern   Not on file  Social History Narrative   Not on file   Social Determinants of Health   Financial Resource Strain: Not on file  Food Insecurity: Not on file  Transportation Needs: Not on file  Physical Activity: Not on file  Stress: Not on file  Social Connections: Not on file  Intimate Partner Violence: Not on file     Current Outpatient Medications:    drospirenone-ethinyl estradiol (SYEDA) 3-0.03 MG tablet, TAKE 1 TABLET BY MOUTH DAILY. CONTINUOUS DOSING, Disp: 84 tablet, Rfl: 0     ROS:  Review of Systems  Constitutional:  Negative for fatigue, fever and unexpected weight change.  Respiratory:  Negative for cough, shortness of breath and wheezing.   Cardiovascular:  Negative for chest pain,  palpitations and leg swelling.  Gastrointestinal:  Negative for blood in stool, constipation, diarrhea, nausea and vomiting.  Endocrine: Negative for cold intolerance, heat intolerance and polyuria.  Genitourinary:  Positive for vaginal discharge. Negative for dyspareunia, dysuria, flank pain, frequency, genital sores, hematuria, menstrual problem, pelvic pain, urgency, vaginal bleeding and vaginal pain.  Musculoskeletal:  Negative for back pain, joint swelling and myalgias.  Skin:  Negative for rash.  Neurological:  Negative for dizziness, syncope, light-headedness, numbness and headaches.  Hematological:  Negative for adenopathy.  Psychiatric/Behavioral:  Negative for agitation, confusion, sleep disturbance and suicidal ideas. The patient is not nervous/anxious.    BREAST: No  symptoms   Objective: LMP 11/20/2021 (Approximate)    Physical Exam Constitutional:      Appearance: She is well-developed.  Genitourinary:     Vulva normal.     Right Labia: No rash, tenderness or lesions.    Left Labia: No tenderness, lesions or rash.    No vaginal discharge, erythema or tenderness.      Right Adnexa: not tender and no mass present.    Left Adnexa: not tender and no mass present.    No cervical friability or polyp.     Uterus is not enlarged or tender.  Breasts:    Right: No mass, nipple discharge, skin change or tenderness.     Left: No mass, nipple discharge, skin change or tenderness.  Neck:     Thyroid: No thyromegaly.  Cardiovascular:     Rate and Rhythm: Normal rate and regular rhythm.     Heart sounds: Normal heart sounds. No murmur heard. Pulmonary:     Effort: Pulmonary effort is normal.     Breath sounds: Normal breath sounds.  Abdominal:     Palpations: Abdomen is soft.     Tenderness: There is no abdominal tenderness. There is no guarding or rebound.  Musculoskeletal:        General: Normal range of motion.     Cervical back: Normal range of motion.  Lymphadenopathy:     Cervical: No cervical adenopathy.  Neurological:     General: No focal deficit present.     Mental Status: She is alert and oriented to person, place, and time.     Cranial Nerves: No cranial nerve deficit.  Skin:    General: Skin is warm and dry.  Psychiatric:        Mood and Affect: Mood normal.        Behavior: Behavior normal.        Thought Content: Thought content normal.        Judgment: Judgment normal.  Vitals reviewed.     Results: No results found for this or any previous visit (from the past 24 hour(s)).   Assessment/Plan: Encounter for annual routine gynecological examination  Screening for STD (sexually transmitted disease) - Plan: Cervicovaginal ancillary only  Encounter for surveillance of contraceptive pills - Plan: drospirenone-ethinyl  estradiol (YASMIN) 3-0.03 MG tablet; OCP RF cont dosing  Bacterial vaginosis - Plan: POCT Wet Prep with KOH, metroNIDAZOLE (METROGEL) 0.75 % vaginal gel; pos sx and wet prep. Rx metrogel. Will RF prn sx. No EtOH.  No orders of the defined types were placed in this encounter.            GYN counsel adequate intake of calcium and vitamin D, diet and exercise     F/U  No follow-ups on file.  Bodey Frizell B. Aerith Canal, PA-C 01/03/2022 9:37 PM

## 2022-01-04 ENCOUNTER — Encounter: Payer: Self-pay | Admitting: Obstetrics and Gynecology

## 2022-01-04 ENCOUNTER — Ambulatory Visit (INDEPENDENT_AMBULATORY_CARE_PROVIDER_SITE_OTHER): Payer: 59 | Admitting: Obstetrics and Gynecology

## 2022-01-04 ENCOUNTER — Other Ambulatory Visit (HOSPITAL_COMMUNITY)
Admission: RE | Admit: 2022-01-04 | Discharge: 2022-01-04 | Disposition: A | Discharge status: Home / Self Care | Payer: 59 | Source: Ambulatory Visit | Attending: Obstetrics and Gynecology | Admitting: Obstetrics and Gynecology

## 2022-01-04 VITALS — BP 128/80 | Ht 72.0 in | Wt 185.0 lb

## 2022-01-04 DIAGNOSIS — Z3041 Encounter for surveillance of contraceptive pills: Secondary | ICD-10-CM

## 2022-01-04 DIAGNOSIS — Z124 Encounter for screening for malignant neoplasm of cervix: Secondary | ICD-10-CM

## 2022-01-04 DIAGNOSIS — Z01419 Encounter for gynecological examination (general) (routine) without abnormal findings: Secondary | ICD-10-CM | POA: Diagnosis not present

## 2022-01-04 MED ORDER — DROSPIRENONE-ETHINYL ESTRADIOL 3-0.03 MG PO TABS: ORAL_TABLET | ORAL | 3 refills | Status: DC

## 2022-01-04 NOTE — Patient Instructions (Signed)
I value your feedback and you entrusting us with your care. If you get a [facility] patient survey, I would appreciate you taking the time to let us know about your experience today. Thank you! ? ? ?

## 2022-01-09 NOTE — Progress Notes (Signed)
Pls have [facility] add HPV DNA. Thx.

## 2022-01-10 LAB — CYTOLOGY - PAP
Adequacy: ABSENT
Comment: NEGATIVE
High risk HPV: POSITIVE — AB

## 2022-01-16 ENCOUNTER — Other Ambulatory Visit: Payer: Self-pay | Admitting: Obstetrics and Gynecology

## 2022-01-16 DIAGNOSIS — Z3041 Encounter for surveillance of contraceptive pills: Secondary | ICD-10-CM

## 2022-02-02 ENCOUNTER — Inpatient Hospital Stay: Payer: 59

## 2022-02-02 ENCOUNTER — Encounter: Payer: Self-pay | Admitting: Emergency Medicine

## 2022-02-02 ENCOUNTER — Inpatient Hospital Stay
Admission: EM | Admit: 2022-02-02 | Discharge: 2022-02-04 | DRG: 369 | Disposition: A | Discharge status: Home / Self Care | Payer: 59 | Attending: Internal Medicine | Admitting: Internal Medicine

## 2022-02-02 DIAGNOSIS — F419 Anxiety disorder, unspecified: Secondary | ICD-10-CM | POA: Diagnosis present

## 2022-02-02 DIAGNOSIS — F10239 Alcohol dependence with withdrawal, unspecified: Secondary | ICD-10-CM | POA: Diagnosis present

## 2022-02-02 DIAGNOSIS — F1020 Alcohol dependence, uncomplicated: Secondary | ICD-10-CM | POA: Diagnosis not present

## 2022-02-02 DIAGNOSIS — Z818 Family history of other mental and behavioral disorders: Secondary | ICD-10-CM | POA: Diagnosis not present

## 2022-02-02 DIAGNOSIS — E876 Hypokalemia: Secondary | ICD-10-CM | POA: Diagnosis not present

## 2022-02-02 DIAGNOSIS — K219 Gastro-esophageal reflux disease without esophagitis: Secondary | ICD-10-CM | POA: Diagnosis not present

## 2022-02-02 DIAGNOSIS — F10930 Alcohol use, unspecified with withdrawal, uncomplicated: Secondary | ICD-10-CM | POA: Diagnosis not present

## 2022-02-02 DIAGNOSIS — R69 Illness, unspecified: Secondary | ICD-10-CM | POA: Diagnosis not present

## 2022-02-02 DIAGNOSIS — R000 Tachycardia, unspecified: Secondary | ICD-10-CM | POA: Diagnosis present

## 2022-02-02 DIAGNOSIS — E860 Dehydration: Secondary | ICD-10-CM | POA: Diagnosis present

## 2022-02-02 DIAGNOSIS — R748 Abnormal levels of other serum enzymes: Secondary | ICD-10-CM

## 2022-02-02 DIAGNOSIS — N179 Acute kidney failure, unspecified: Secondary | ICD-10-CM

## 2022-02-02 DIAGNOSIS — Z808 Family history of malignant neoplasm of other organs or systems: Secondary | ICD-10-CM | POA: Diagnosis not present

## 2022-02-02 DIAGNOSIS — E8729 Other acidosis: Secondary | ICD-10-CM | POA: Diagnosis present

## 2022-02-02 DIAGNOSIS — K226 Gastro-esophageal laceration-hemorrhage syndrome: Principal | ICD-10-CM | POA: Diagnosis present

## 2022-02-02 DIAGNOSIS — K920 Hematemesis: Secondary | ICD-10-CM

## 2022-02-02 DIAGNOSIS — Z811 Family history of alcohol abuse and dependence: Secondary | ICD-10-CM

## 2022-02-02 DIAGNOSIS — F10939 Alcohol use, unspecified with withdrawal, unspecified: Secondary | ICD-10-CM

## 2022-02-02 LAB — CBC WITH DIFFERENTIAL/PLATELET
Abs Immature Granulocytes: 0.07 10*3/uL (ref 0.00–0.07)
Basophils Absolute: 0 10*3/uL (ref 0.0–0.1)
Basophils Relative: 0 %
Eosinophils Absolute: 0 10*3/uL (ref 0.0–0.5)
Eosinophils Relative: 0 %
HCT: 46.1 % — ABNORMAL HIGH (ref 36.0–46.0)
Hemoglobin: 14.8 g/dL (ref 12.0–15.0)
Immature Granulocytes: 1 %
Lymphocytes Relative: 6 %
Lymphs Abs: 0.9 10*3/uL (ref 0.7–4.0)
MCH: 28 pg (ref 26.0–34.0)
MCHC: 32.1 g/dL (ref 30.0–36.0)
MCV: 87.3 fL (ref 80.0–100.0)
Monocytes Absolute: 1 10*3/uL (ref 0.1–1.0)
Monocytes Relative: 7 %
Neutro Abs: 13.1 10*3/uL — ABNORMAL HIGH (ref 1.7–7.7)
Neutrophils Relative %: 86 %
Platelets: 281 10*3/uL (ref 150–400)
RBC: 5.28 MIL/uL — ABNORMAL HIGH (ref 3.87–5.11)
RDW: 14.8 % (ref 11.5–15.5)
WBC: 15.1 10*3/uL — ABNORMAL HIGH (ref 4.0–10.5)
nRBC: 0 % (ref 0.0–0.2)

## 2022-02-02 LAB — HEMOGLOBIN AND HEMATOCRIT, BLOOD
HCT: 32.8 % — ABNORMAL LOW (ref 36.0–46.0)
HCT: 34.1 % — ABNORMAL LOW (ref 36.0–46.0)
HCT: 36.4 % (ref 36.0–46.0)
Hemoglobin: 10.7 g/dL — ABNORMAL LOW (ref 12.0–15.0)
Hemoglobin: 11.4 g/dL — ABNORMAL LOW (ref 12.0–15.0)
Hemoglobin: 11.6 g/dL — ABNORMAL LOW (ref 12.0–15.0)

## 2022-02-02 LAB — COMPREHENSIVE METABOLIC PANEL
ALT: 121 U/L — ABNORMAL HIGH (ref 0–44)
AST: 130 U/L — ABNORMAL HIGH (ref 15–41)
Albumin: 5.1 g/dL — ABNORMAL HIGH (ref 3.5–5.0)
Alkaline Phosphatase: 99 U/L (ref 38–126)
Anion gap: 40 — ABNORMAL HIGH (ref 5–15)
BUN: 27 mg/dL — ABNORMAL HIGH (ref 6–20)
CO2: 15 mmol/L — ABNORMAL LOW (ref 22–32)
Calcium: 11.3 mg/dL — ABNORMAL HIGH (ref 8.9–10.3)
Chloride: 84 mmol/L — ABNORMAL LOW (ref 98–111)
Creatinine, Ser: 1.95 mg/dL — ABNORMAL HIGH (ref 0.44–1.00)
GFR, Estimated: 35 mL/min — ABNORMAL LOW (ref >60)
Glucose, Bld: 148 mg/dL — ABNORMAL HIGH (ref 70–99)
Potassium: 4.2 mmol/L (ref 3.5–5.1)
Sodium: 139 mmol/L (ref 135–145)
Total Bilirubin: 2.1 mg/dL — ABNORMAL HIGH (ref 0.3–1.2)
Total Protein: 10.6 g/dL — ABNORMAL HIGH (ref 6.5–8.1)

## 2022-02-02 LAB — TROPONIN I (HIGH SENSITIVITY)
Troponin I (High Sensitivity): 14 ng/L (ref <18)
Troponin I (High Sensitivity): 17 ng/L (ref <18)

## 2022-02-02 LAB — URINALYSIS, ROUTINE W REFLEX MICROSCOPIC
Bilirubin Urine: NEGATIVE
Glucose, UA: >=500 mg/dL — AB
Ketones, ur: 80 mg/dL — AB
Leukocytes,Ua: NEGATIVE
Nitrite: NEGATIVE
Protein, ur: 100 mg/dL — AB
Specific Gravity, Urine: 1.017 (ref 1.005–1.030)
pH: 5 (ref 5.0–8.0)

## 2022-02-02 LAB — TYPE AND SCREEN
ABO/RH(D): O POS
Antibody Screen: NEGATIVE

## 2022-02-02 LAB — LIPASE, BLOOD: Lipase: 139 U/L — ABNORMAL HIGH (ref 11–51)

## 2022-02-02 LAB — PHOSPHORUS: Phosphorus: 2.2 mg/dL — ABNORMAL LOW (ref 2.5–4.6)

## 2022-02-02 LAB — PREGNANCY, URINE: Preg Test, Ur: NEGATIVE

## 2022-02-02 LAB — APTT: aPTT: 30 seconds (ref 24–36)

## 2022-02-02 LAB — PROTIME-INR
INR: 1.1 (ref 0.8–1.2)
Prothrombin Time: 13.9 seconds (ref 11.4–15.2)

## 2022-02-02 LAB — MAGNESIUM: Magnesium: 1.5 mg/dL — ABNORMAL LOW (ref 1.7–2.4)

## 2022-02-02 MED ORDER — ADULT MULTIVITAMIN W/MINERALS CH
1.0000 | ORAL_TABLET | Freq: Every day | ORAL | Status: DC
  Administered 2022-02-03: 1 via ORAL
  Filled 2022-02-02: qty 1

## 2022-02-02 MED ORDER — DEXTROSE IN LACTATED RINGERS 5 % IV SOLN: INTRAVENOUS | Status: DC

## 2022-02-02 MED ORDER — LORAZEPAM 2 MG/ML IJ SOLN
0.0000 mg | Freq: Four times a day (QID) | INTRAMUSCULAR | Status: AC
  Administered 2022-02-03: 2 mg via INTRAVENOUS
  Filled 2022-02-02: qty 1

## 2022-02-02 MED ORDER — THIAMINE HCL 100 MG/ML IJ SOLN: 100.0000 mg | Freq: Every day | INTRAMUSCULAR | Status: DC

## 2022-02-02 MED ORDER — MAGNESIUM SULFATE 2 GM/50ML IV SOLN
2.0000 g | Freq: Once | INTRAVENOUS | Status: AC
  Administered 2022-02-02: 2 g via INTRAVENOUS
  Filled 2022-02-02: qty 50

## 2022-02-02 MED ORDER — FOLIC ACID 5 MG/ML IJ SOLN
1.0000 mg | Freq: Every day | INTRAMUSCULAR | Status: DC
  Administered 2022-02-02: 1 mg via INTRAVENOUS
  Filled 2022-02-02 (×3): qty 0.2

## 2022-02-02 MED ORDER — LORAZEPAM 2 MG PO TABS: 0.0000 mg | ORAL_TABLET | Freq: Two times a day (BID) | ORAL | Status: DC

## 2022-02-02 MED ORDER — ENOXAPARIN SODIUM 40 MG/0.4ML IJ SOSY: 40.0000 mg | PREFILLED_SYRINGE | INTRAMUSCULAR | Status: DC

## 2022-02-02 MED ORDER — ACETAMINOPHEN 650 MG RE SUPP: 650.0000 mg | Freq: Four times a day (QID) | RECTAL | Status: DC | PRN

## 2022-02-02 MED ORDER — ONDANSETRON HCL 4 MG/2ML IJ SOLN
4.0000 mg | Freq: Once | INTRAMUSCULAR | Status: AC
  Administered 2022-02-02: 4 mg via INTRAVENOUS
  Filled 2022-02-02: qty 2

## 2022-02-02 MED ORDER — LORAZEPAM 2 MG/ML IJ SOLN: 1.0000 mg | INTRAMUSCULAR | Status: DC | PRN

## 2022-02-02 MED ORDER — LACTATED RINGERS IV BOLUS
1000.0000 mL | Freq: Once | INTRAVENOUS | Status: AC
  Administered 2022-02-02: 1000 mL via INTRAVENOUS

## 2022-02-02 MED ORDER — LORAZEPAM 2 MG/ML IJ SOLN: 0.0000 mg | Freq: Two times a day (BID) | INTRAMUSCULAR | Status: DC

## 2022-02-02 MED ORDER — THIAMINE HCL 100 MG/ML IJ SOLN
100.0000 mg | Freq: Every day | INTRAMUSCULAR | Status: DC
  Administered 2022-02-02 – 2022-02-03 (×2): 100 mg via INTRAVENOUS
  Filled 2022-02-02 (×2): qty 2

## 2022-02-02 MED ORDER — DEXTROSE 5 % IN LACTATED RINGERS IV BOLUS
2000.0000 mL | Freq: Once | INTRAVENOUS | Status: AC
  Administered 2022-02-02: 2000 mL via INTRAVENOUS

## 2022-02-02 MED ORDER — THIAMINE MONONITRATE 100 MG PO TABS: 100.0000 mg | ORAL_TABLET | Freq: Every day | ORAL | Status: DC

## 2022-02-02 MED ORDER — PANTOPRAZOLE INFUSION (NEW) - SIMPLE MED
8.0000 mg/h | INTRAVENOUS | Status: DC
  Administered 2022-02-02 – 2022-02-03 (×3): 8 mg/h via INTRAVENOUS
  Filled 2022-02-02 (×6): qty 100

## 2022-02-02 MED ORDER — PANTOPRAZOLE 80MG IVPB - SIMPLE MED
80.0000 mg | Freq: Once | INTRAVENOUS | Status: AC
  Administered 2022-02-02: 80 mg via INTRAVENOUS
  Filled 2022-02-02: qty 100

## 2022-02-02 MED ORDER — ACETAMINOPHEN 325 MG PO TABS: 650.0000 mg | ORAL_TABLET | Freq: Four times a day (QID) | ORAL | Status: DC | PRN

## 2022-02-02 MED ORDER — PANTOPRAZOLE SODIUM 40 MG IV SOLR: 40.0000 mg | Freq: Two times a day (BID) | INTRAVENOUS | Status: DC

## 2022-02-02 MED ORDER — LORAZEPAM 2 MG/ML IJ SOLN
2.0000 mg | Freq: Once | INTRAMUSCULAR | Status: AC
  Administered 2022-02-02: 2 mg via INTRAVENOUS
  Filled 2022-02-02: qty 1

## 2022-02-02 MED ORDER — ONDANSETRON HCL 4 MG/2ML IJ SOLN
4.0000 mg | Freq: Four times a day (QID) | INTRAMUSCULAR | Status: DC | PRN
  Administered 2022-02-03: 4 mg via INTRAVENOUS
  Filled 2022-02-02: qty 2

## 2022-02-02 MED ORDER — LORAZEPAM 2 MG/ML IJ SOLN
2.0000 mg | Freq: Once | INTRAMUSCULAR | Status: AC
  Administered 2022-02-02: 2 mg via INTRAVENOUS

## 2022-02-02 MED ORDER — METOCLOPRAMIDE HCL 5 MG/ML IJ SOLN
10.0000 mg | Freq: Once | INTRAMUSCULAR | Status: AC
  Administered 2022-02-02: 10 mg via INTRAVENOUS
  Filled 2022-02-02: qty 2

## 2022-02-02 MED ORDER — ONDANSETRON HCL 4 MG PO TABS: 4.0000 mg | ORAL_TABLET | Freq: Four times a day (QID) | ORAL | Status: DC | PRN

## 2022-02-02 MED ORDER — SODIUM CHLORIDE 0.9 % IV SOLN
1.0000 g | Freq: Once | INTRAVENOUS | Status: AC
  Administered 2022-02-02: 1 g via INTRAVENOUS
  Filled 2022-02-02: qty 10

## 2022-02-02 MED ORDER — LORAZEPAM 2 MG PO TABS: 0.0000 mg | ORAL_TABLET | Freq: Four times a day (QID) | ORAL | Status: AC

## 2022-02-02 MED ORDER — LORAZEPAM 2 MG/ML IJ SOLN
INTRAMUSCULAR | Status: AC
  Filled 2022-02-02: qty 1

## 2022-02-02 MED ORDER — LORAZEPAM 1 MG PO TABS
1.0000 mg | ORAL_TABLET | ORAL | Status: DC | PRN
  Administered 2022-02-02: 1 mg via ORAL
  Administered 2022-02-03: 2 mg via ORAL
  Filled 2022-02-02: qty 1
  Filled 2022-02-02: qty 2

## 2022-02-02 MED ORDER — FOLIC ACID 1 MG PO TABS
1.0000 mg | ORAL_TABLET | Freq: Every day | ORAL | Status: DC
  Administered 2022-02-03: 1 mg via ORAL
  Filled 2022-02-02: qty 1

## 2022-02-02 NOTE — Progress Notes (Signed)
Patient was seen and examined in the emergency room.  She complains of generalized weakness, fatigue and headache.  She also reported nausea and said he had vomited once earlier this morning.  She does not think there was blood in the vomitus.  No abdominal pain or bloody stools.  Continue IV fluids.  We will try full liquid diet later today.  Repeat labs tomorrow.  Use Ativan as needed for CIWA protocol.  Continue IV Protonix for now.

## 2022-02-02 NOTE — Assessment & Plan Note (Signed)
Elevated LFTs, metabolic acidosis with elevated anion gap Aggressive fluid resuscitation and monitor LFTs Follow-up CT abdomen and pelvis. Consider right upper quadrant ultrasound

## 2022-02-02 NOTE — Plan of Care (Signed)
The patient is admitted from ICU to 2 A 151. A & o x 4. She denies any acute pain. Full assessment to epic completed. Will continue to monitor.

## 2022-02-02 NOTE — Assessment & Plan Note (Signed)
IV magnesium ordered Monitor electrolytes

## 2022-02-02 NOTE — ED Notes (Signed)
Pt ambulated to bedside commode with this nurse at side. Pt complains of feeling weak however ambulated with steady gait. Pt provided with ice chips and tolerated well. Denies any nausea. Father at bedside at this time. Call bell in reach. Bed in lowest locked position.

## 2022-02-02 NOTE — ED Notes (Signed)
Per provider pt may have ice chips. Pt began to spit up after ice chips. Denies any nausea at this time. Call bell in reach bed in lowest locked position.

## 2022-02-02 NOTE — ED Triage Notes (Signed)
Pt presents via POV for detox of alcohol - pts last drink was yesterday 11AM. She notes drinking vodka daily. Endorses "red" colored emesis. Denies CP or SOB.

## 2022-02-02 NOTE — H&P (Signed)
History and Physical    Patient: Heather Kaiser RDE:[id number] DOB: 07-06-1990 DOA: 02/02/2022 DOS: the patient was seen and examined on 02/02/2022 PCP: Ellene Route  Patient coming from: Home  Chief Complaint:  Chief Complaint  Patient presents with   Alcohol Problem    HPI: Heather Kaiser is a 31 y.o. female with medical history significant for Severe alcohol use disorder drinks a large bottle of vodka every 2 days and who stopped drinking 2 days prior who presents to the ED with intractable vomiting about 30-50 episodes which was initially nonbloody not bilious but then became bloody with bright red blood.  History is given by stepfather at the bedside.  She has had no black or bloody stool.  Unable to get history from patient at this time as she was given Ativan in the ED for alcohol withdrawal and is now very somnolent ED course and data review: On arrival heart rate in the 170s, now 143, tachypneic to 22 with blood pressure now 140/96.  Labs significant for AST 130, ALT 121 and total bilirubin 2.1.  Creatinine 1.95 with anion gap of 40 and bicarb 15.  Glucose 148.  Magnesium 1.5, lipase 139.  WBC 15,000, hemoglobin 14.8.  Troponin 17. EKG showing sinus tachycardia at 161. Patient was given a 2 L LR bolus and started on a D5 LR infusion and CIWA withdrawal protocol.  Hospitalist consulted for admission.     Past Medical History:  Diagnosis Date   Anemia 2019   Anxiety    Fibroadenoma of breast, left    Fibroadenoma of breast, right 2020   per u/s   Scoliosis    Past Surgical History:  Procedure Laterality Date   BREAST BIOPSY     Social History:  reports that she has never smoked. She has never used smokeless tobacco. She reports current alcohol use of about 42.0 standard drinks of alcohol per week. She reports that she does not use drugs.  No Known Allergies  Family History  Problem Relation Age of Onset   Depression Mother    Depression Father    Alcohol abuse  Father    Mental illness Sister    Thyroid cancer Maternal Grandmother    Bipolar disorder Half-Sister    ADD / ADHD Half-Sister    Breast cancer Neg Hx    Ovarian cancer Neg Hx     Prior to Admission medications   Medication Sig Start Date End Date Taking? Authorizing Provider  sertraline (ZOLOFT) 50 MG tablet Take 50 mg by mouth daily. 01/23/22 01/23/23 Yes [provider]  drospirenone-ethinyl estradiol (SYEDA) 3-0.03 MG tablet TAKE 1 TABLET BY MOUTH DAILY. CONTINUOUS DOSING 6/31/49   Amalia Greenhouse    Physical Exam: Vitals:   02/02/22 0130 02/02/22 0149 02/02/22 0300 02/02/22 0303  BP: (!) 145/109 (!) 145/109 (!) 140/96 (!) 140/96  Pulse: (!) 133 (!) 145 (!) 143 (!) 143  Resp: 20  17   Temp:   98 F (36.7 C)   TempSrc:   Oral   SpO2: 96%  99%   Weight:      Height:       Physical Exam Vitals and nursing note reviewed.  Constitutional:      General: She is not in acute distress.    Comments: Somnolent secondary to Ativan administered  HENT:     Head: Normocephalic and atraumatic.  Cardiovascular:     Rate and Rhythm: Normal rate and regular rhythm.     Heart sounds:  Normal heart sounds.  Pulmonary:     Effort: Pulmonary effort is normal.     Breath sounds: Normal breath sounds.  Abdominal:     Palpations: Abdomen is soft.     Tenderness: There is no abdominal tenderness.  Neurological:     Comments: Somnolent.  No focal deficits     Labs on Admission: I have personally reviewed following labs and imaging studies  CBC: Recent Labs  Lab 02/02/22 0107  WBC 15.1*  NEUTROABS 13.1*  HGB 14.8  HCT 46.1*  MCV 87.3  PLT 195   Basic Metabolic Panel: Recent Labs  Lab 02/02/22 0107  NA 139  K 4.2  CL 84*  CO2 15*  GLUCOSE 148*  BUN 27*  CREATININE 1.95*  CALCIUM 11.3*  MG 1.5*   GFR: Estimated Creatinine Clearance: 49.6 mL/min (A) (by C-G formula based on SCr of 1.95 mg/dL (H)). Liver Function Tests: Recent Labs  Lab 02/02/22 0107   AST 130*  ALT 121*  ALKPHOS 99  BILITOT 2.1*  PROT 10.6*  ALBUMIN 5.1*   Recent Labs  Lab 02/02/22 0107  LIPASE 139*   No results for input(s): "AMMONIA" in the last 168 hours. Coagulation Profile: Recent Labs  Lab 02/02/22 0107  INR 1.1   Cardiac Enzymes: No results for input(s): "CKTOTAL", "CKMB", "CKMBINDEX", "TROPONINI" in the last 168 hours. BNP (last 3 results) No results for input(s): "PROBNP" in the last 8760 hours. HbA1C: No results for input(s): "HGBA1C" in the last 72 hours. CBG: No results for input(s): "GLUCAP" in the last 168 hours. Lipid Profile: No results for input(s): "CHOL", "HDL", "LDLCALC", "TRIG", "CHOLHDL", "LDLDIRECT" in the last 72 hours. Thyroid Function Tests: No results for input(s): "TSH", "T4TOTAL", "FREET4", "T3FREE", "THYROIDAB" in the last 72 hours. Anemia Panel: No results for input(s): "VITAMINB12", "FOLATE", "FERRITIN", "TIBC", "IRON", "RETICCTPCT" in the last 72 hours. Urine analysis: No results found for: "COLORURINE", "APPEARANCEUR", "LABSPEC", "PHURINE", "GLUCOSEU", "HGBUR", "BILIRUBINUR", "KETONESUR", "PROTEINUR", "UROBILINOGEN", "NITRITE", "LEUKOCYTESUR"  Radiological Exams on Admission: No results found.   Data Reviewed: Relevant notes from primary care and specialist visits, past discharge summaries as available in EHR, including Care Everywhere. Prior diagnostic testing as pertinent to current admission diagnoses Updated medications and problem lists for reconciliation ED course, including vitals, labs, imaging, treatment and response to treatment Triage notes, nursing and pharmacy notes and ED provider's notes Notable results as noted in HPI   Assessment and Plan: * Hematemesis Suspect Mallory-Weiss tear from intractable vomiting Monitor H&H IV hydration Consider GI consult if H&H downtrending  Hypomagnesemia IV magnesium ordered Monitor electrolytes  Hypercalcemia Hydrate and monitor  Elevated  lipase Patient had no abdominal pain but intractable vomiting Follow-up CT abdomen and pelvis  AKI (acute kidney injury) ([place]) Creatinine 1.95 with no baseline on record Continue aggressive fluid resuscitation, avoid nephrotoxins  Alcohol withdrawal syndrome (Start) Severe alcohol use disorder Patient presents with intractable nausea and vomiting a day after stopping heavy drinking, associated with tremulousness CIWA withdrawal protocol Stepfather at bedside says that patient is interested in detox.  Says she has tried quitting before but always on her own  Alcoholic ketoacidosis Elevated LFTs, metabolic acidosis with elevated anion gap Aggressive fluid resuscitation and monitor LFTs Follow-up CT abdomen and pelvis. Consider right upper quadrant ultrasound        DVT prophylaxis: SCDs  Consults: none  Advance Care Planning:   Code Status: Full Code   Family Communication: Stepfather at bedside  Disposition Plan: Back to previous home environment  Severity of  Illness: The appropriate patient status for this patient is INPATIENT. Inpatient status is judged to be reasonable and necessary in order to provide the required intensity of service to ensure the patient's safety. The patient's presenting symptoms, physical exam findings, and initial radiographic and laboratory data in the context of their chronic comorbidities is felt to place them at high risk for further clinical deterioration. Furthermore, it is not anticipated that the patient will be medically stable for discharge from the hospital within 2 midnights of admission.   * I certify that at the point of admission it is my clinical judgment that the patient will require inpatient hospital care spanning beyond 2 midnights from the point of admission due to high intensity of service, high risk for further deterioration and high frequency of surveillance required.*  Author: Athena Masse, MD 02/02/2022 4:20 AM  For on  call review [email or web address].

## 2022-02-02 NOTE — ED Notes (Signed)
Straight cath to obtain urine.  Sample sent for testing.

## 2022-02-02 NOTE — ED Notes (Signed)
Patient transported to CT 

## 2022-02-02 NOTE — Assessment & Plan Note (Signed)
Suspect Mallory-Weiss tear from intractable vomiting Monitor H&H IV hydration Consider GI consult if H&H downtrending

## 2022-02-02 NOTE — Assessment & Plan Note (Signed)
Hydrate and monitor 

## 2022-02-02 NOTE — Assessment & Plan Note (Addendum)
Severe alcohol use disorder Patient presents with intractable nausea and vomiting a day after stopping heavy drinking, associated with tremulousness CIWA withdrawal protocol Stepfather at bedside says that patient is interested in detox.  Says she has tried quitting before but always on her own

## 2022-02-02 NOTE — ED Notes (Signed)
Back from CT

## 2022-02-02 NOTE — ED Provider Notes (Signed)
[facility] Provider Note    Event Date/Time   First MD Initiated Contact with Patient 02/02/22 0106     (approximate)   History   Alcohol Problem   HPI  Heather Kaiser is a 31 y.o. female who presents to the ED for evaluation of Alcohol Problem   I reviewed EGD from [facility] from 3 years ago without signs of varices.  Patient presents to the ED for evaluation of alcohol withdrawals, trembling, nausea and emesis.  Reports her last drink was on Wednesday, she reports drinking a large bottle of vodka "a handle" every 2 days.  Has not drink any today (Thursday) and denies any other recreational drugs.  Reports nausea and vomiting throughout the day today, 30-50 episodes of emesis, initially nonbloody nonbilious, transitioning to hematemesis.  No melena or hematochezia, nosebleeds or other bleeding.  Dizziness, but no syncope.  Denies abdominal pain.  Physical Exam   Triage Vital Signs: ED Triage Vitals [02/02/22 0057]  Enc Vitals Group     BP 112/83     Pulse Rate (!) 170     Resp 20     Temp      Temp Source Oral     SpO2 98 %     Weight      Height      Head Circumference      Peak Flow      Pain Score      Pain Loc      Pain Edu?      Excl. in [place]?     Most recent vital signs: Vitals:   02/02/22 0300 02/02/22 0303  BP: (!) 140/96 (!) 140/96  Pulse: (!) 143 (!) 143  Resp: 17   Temp: 98 F (36.7 C)   SpO2: 99%     General: Awake, no distress.  Dry, appears uncomfortable.  Oriented without signs of cephalopathy CV:  Good peripheral perfusion.  Tachycardic and regular Resp:  Normal effort.  Abd:  No distention.  Soft and benign MSK:  No deformity noted.  Neuro:  No focal deficits appreciated. Other:     ED Results / Procedures / Treatments   Labs (all labs ordered are listed, but only abnormal results are displayed) Labs Reviewed  MAGNESIUM - Abnormal; Notable for the following components:      Result Value   Magnesium 1.5 (*)     All other components within normal limits  COMPREHENSIVE METABOLIC PANEL - Abnormal; Notable for the following components:   Chloride 84 (*)    CO2 15 (*)    Glucose, Bld 148 (*)    BUN 27 (*)    Creatinine, Ser 1.95 (*)    Calcium 11.3 (*)    Total Protein 10.6 (*)    Albumin 5.1 (*)    AST 130 (*)    ALT 121 (*)    Total Bilirubin 2.1 (*)    GFR, Estimated 35 (*)    Anion gap 40 (*)    All other components within normal limits  CBC WITH DIFFERENTIAL/PLATELET - Abnormal; Notable for the following components:   WBC 15.1 (*)    RBC 5.28 (*)    HCT 46.1 (*)    Neutro Abs 13.1 (*)    All other components within normal limits  LIPASE, BLOOD - Abnormal; Notable for the following components:   Lipase 139 (*)    All other components within normal limits  PROTIME-INR  APTT  URINALYSIS, ROUTINE W REFLEX MICROSCOPIC  TYPE AND SCREEN  TROPONIN I (HIGH SENSITIVITY)  TROPONIN I (HIGH SENSITIVITY)    EKG Sinus tachycardia with a rate of 161 bpm.  Normal axis and intervals.  No STEMI.  Nonspecific ST changes inferiorly.  RADIOLOGY   Official radiology report(s): No results found.  PROCEDURES and INTERVENTIONS:  .1-3 Lead EKG Interpretation  Performed by: Vladimir Crofts, MD Authorized by: Vladimir Crofts, MD     Interpretation: abnormal     ECG rate:  130   ECG rate assessment: tachycardic     Rhythm: sinus tachycardia     Ectopy: none     Conduction: normal   .Critical Care  Performed by: Vladimir Crofts, MD Authorized by: Vladimir Crofts, MD   Critical care provider statement:    Critical care time (minutes):  30   Critical care time was exclusive of:  Separately billable procedures and treating other patients   Critical care was necessary to treat or prevent imminent or life-threatening deterioration of the following conditions:  Metabolic crisis   Critical care was time spent personally by me on the following activities:  Development of treatment plan with patient or surrogate,  discussions with consultants, evaluation of patient's response to treatment, examination of patient, ordering and review of laboratory studies, ordering and review of radiographic studies, ordering and performing treatments and interventions, pulse oximetry, re-evaluation of patient's condition and review of old charts   Medications  pantoprozole (PROTONIX) 80 mg /NS 100 mL infusion (8 mg/hr Intravenous New Bag/Given 02/02/22 0307)  pantoprazole (PROTONIX) injection 40 mg (has no administration in time range)  LORazepam (ATIVAN) injection 0-4 mg (has no administration in time range)    Or  LORazepam (ATIVAN) tablet 0-4 mg (has no administration in time range)  LORazepam (ATIVAN) injection 0-4 mg (has no administration in time range)    Or  LORazepam (ATIVAN) tablet 0-4 mg (has no administration in time range)  thiamine (VITAMIN B1) tablet 100 mg (has no administration in time range)    Or  thiamine (VITAMIN B1) injection 100 mg (has no administration in time range)  dextrose 5% lactated ringers bolus 2,000 mL (has no administration in time range)  LORazepam (ATIVAN) injection 2 mg (2 mg Intravenous Not Given 02/02/22 0112)  lactated ringers bolus 1,000 mL (0 mLs Intravenous Stopped 02/02/22 0156)  pantoprazole (PROTONIX) 80 mg /NS 100 mL IVPB (0 mg Intravenous Stopped 02/02/22 0220)  ondansetron (ZOFRAN) injection 4 mg (4 mg Intravenous Given 02/02/22 0146)  cefTRIAXone (ROCEPHIN) 1 g in sodium chloride 0.9 % 100 mL IVPB (0 g Intravenous Stopped 02/02/22 0225)  LORazepam (ATIVAN) injection 2 mg (2 mg Intravenous Given 02/02/22 0146)  lactated ringers bolus 1,000 mL (1,000 mLs Intravenous New Bag/Given 02/02/22 0226)  metoCLOPramide (REGLAN) injection 10 mg (10 mg Intravenous Given 02/02/22 0221)  LORazepam (ATIVAN) injection 2 mg (2 mg Intravenous Given 02/02/22 0220)     IMPRESSION / MDM / [place] / ED COURSE  I reviewed the triage vital signs and the nursing notes.  Differential  diagnosis includes, but is not limited to, alcohol withdrawals, DTs, seizure, AKA, dehydration, pancreatitis  {Patient presents with symptoms of an acute illness or injury that is potentially life-threatening.  31 year old female presents to the ED with alcohol withdrawals and hematemesis, found have evidence of an AKI and alcoholic ketoacidosis as well, requiring medical admission.  She is tachycardic but hemodynamically stable.  Looks dry.  Benign abdomen.  Reassuring CBC without signs of significant blood loss anemia, leukocytosis is noted but  I see no source of sepsis or infectious etiology of her symptoms.  She was provided Rocephin prophylactically.  Doubt SBP.  Significant metabolic derangements with AKA, AKI and hypomagnesemia.  She was provided multiple rounds of IV Ativan and IV fluids with improvement of her symptoms.  Consulted with medicine who agrees to admit.  Clinical Course as of 02/02/22 0313  Fri Feb 02, 2022  0127 reassessed [DS]  0151 reassessed [DS]  0253 I consult with hospitalist who agrees to admit [DS]    Clinical Course User Index [DS] Vladimir Crofts, MD     FINAL CLINICAL IMPRESSION(S) / ED DIAGNOSES   Final diagnoses:  Alcoholic ketoacidosis  Dehydration  AKI (acute kidney injury) ([place])  Alcohol withdrawal syndrome with complication ([place])     Rx / DC Orders   ED Discharge Orders     None        Note:  This document was prepared using Dragon voice recognition software and may include unintentional dictation errors.   Vladimir Crofts, MD 02/02/22 [phone]

## 2022-02-02 NOTE — Assessment & Plan Note (Signed)
Creatinine 1.95 with no baseline on record Continue aggressive fluid resuscitation, avoid nephrotoxins

## 2022-02-02 NOTE — Assessment & Plan Note (Signed)
Patient had no abdominal pain but intractable vomiting Follow-up CT abdomen and pelvis

## 2022-02-03 DIAGNOSIS — N179 Acute kidney failure, unspecified: Secondary | ICD-10-CM | POA: Diagnosis not present

## 2022-02-03 DIAGNOSIS — F1020 Alcohol dependence, uncomplicated: Secondary | ICD-10-CM | POA: Diagnosis not present

## 2022-02-03 DIAGNOSIS — E8729 Other acidosis: Secondary | ICD-10-CM | POA: Diagnosis not present

## 2022-02-03 DIAGNOSIS — K920 Hematemesis: Secondary | ICD-10-CM | POA: Diagnosis not present

## 2022-02-03 LAB — COMPREHENSIVE METABOLIC PANEL
ALT: 55 U/L — ABNORMAL HIGH (ref 0–44)
AST: 51 U/L — ABNORMAL HIGH (ref 15–41)
Albumin: 3.6 g/dL (ref 3.5–5.0)
Alkaline Phosphatase: 57 U/L (ref 38–126)
Anion gap: 7 (ref 5–15)
BUN: 15 mg/dL (ref 6–20)
CO2: 31 mmol/L (ref 22–32)
Calcium: 8.6 mg/dL — ABNORMAL LOW (ref 8.9–10.3)
Chloride: 99 mmol/L (ref 98–111)
Creatinine, Ser: 0.75 mg/dL (ref 0.44–1.00)
GFR, Estimated: >60 mL/min (ref >60)
Glucose, Bld: 101 mg/dL — ABNORMAL HIGH (ref 70–99)
Potassium: 3 mmol/L — ABNORMAL LOW (ref 3.5–5.1)
Sodium: 137 mmol/L (ref 135–145)
Total Bilirubin: 1.2 mg/dL (ref 0.3–1.2)
Total Protein: 6.5 g/dL (ref 6.5–8.1)

## 2022-02-03 LAB — CBC WITH DIFFERENTIAL/PLATELET
Abs Immature Granulocytes: 0.03 10*3/uL (ref 0.00–0.07)
Basophils Absolute: 0 10*3/uL (ref 0.0–0.1)
Basophils Relative: 0 %
Eosinophils Absolute: 0 10*3/uL (ref 0.0–0.5)
Eosinophils Relative: 0 %
HCT: 31.9 % — ABNORMAL LOW (ref 36.0–46.0)
Hemoglobin: 10.5 g/dL — ABNORMAL LOW (ref 12.0–15.0)
Immature Granulocytes: 0 %
Lymphocytes Relative: 13 %
Lymphs Abs: 1.3 10*3/uL (ref 0.7–4.0)
MCH: 28.2 pg (ref 26.0–34.0)
MCHC: 32.9 g/dL (ref 30.0–36.0)
MCV: 85.5 fL (ref 80.0–100.0)
Monocytes Absolute: 0.9 10*3/uL (ref 0.1–1.0)
Monocytes Relative: 9 %
Neutro Abs: 7.7 10*3/uL (ref 1.7–7.7)
Neutrophils Relative %: 78 %
Platelets: 149 10*3/uL — ABNORMAL LOW (ref 150–400)
RBC: 3.73 MIL/uL — ABNORMAL LOW (ref 3.87–5.11)
RDW: 14.7 % (ref 11.5–15.5)
WBC: 9.9 10*3/uL (ref 4.0–10.5)
nRBC: 0 % (ref 0.0–0.2)

## 2022-02-03 LAB — PHOSPHORUS
Phosphorus: <1 mg/dL — CL (ref 2.5–4.6)
Phosphorus: <1 mg/dL — CL (ref 2.5–4.6)

## 2022-02-03 LAB — HEMOGLOBIN AND HEMATOCRIT, BLOOD
HCT: 33.4 % — ABNORMAL LOW (ref 36.0–46.0)
HCT: 36.1 % (ref 36.0–46.0)
Hemoglobin: 10.9 g/dL — ABNORMAL LOW (ref 12.0–15.0)
Hemoglobin: 11.5 g/dL — ABNORMAL LOW (ref 12.0–15.0)

## 2022-02-03 LAB — MAGNESIUM
Magnesium: 1.7 mg/dL (ref 1.7–2.4)
Magnesium: 2.3 mg/dL (ref 1.7–2.4)

## 2022-02-03 LAB — POTASSIUM: Potassium: 3.4 mmol/L — ABNORMAL LOW (ref 3.5–5.1)

## 2022-02-03 MED ORDER — MAGNESIUM OXIDE -MG SUPPLEMENT 400 (240 MG) MG PO TABS
400.0000 mg | ORAL_TABLET | Freq: Two times a day (BID) | ORAL | Status: DC
  Administered 2022-02-03 (×2): 400 mg via ORAL
  Filled 2022-02-03 (×2): qty 1

## 2022-02-03 MED ORDER — POTASSIUM PHOSPHATES 15 MMOLE/5ML IV SOLN
30.0000 mmol | Freq: Once | INTRAVENOUS | Status: AC
  Administered 2022-02-03: 30 mmol via INTRAVENOUS
  Filled 2022-02-03: qty 10

## 2022-02-03 MED ORDER — POTASSIUM CHLORIDE CRYS ER 20 MEQ PO TBCR
40.0000 meq | EXTENDED_RELEASE_TABLET | Freq: Once | ORAL | Status: AC
  Administered 2022-02-03: 40 meq via ORAL
  Filled 2022-02-03: qty 2

## 2022-02-03 MED ORDER — POTASSIUM CHLORIDE 10 MEQ/100ML IV SOLN
10.0000 meq | INTRAVENOUS | Status: AC
  Administered 2022-02-03 (×2): 10 meq via INTRAVENOUS
  Filled 2022-02-03 (×2): qty 100

## 2022-02-03 MED ORDER — MAGNESIUM SULFATE 2 GM/50ML IV SOLN
2.0000 g | Freq: Once | INTRAVENOUS | Status: AC
  Administered 2022-02-03: 2 g via INTRAVENOUS
  Filled 2022-02-03: qty 50

## 2022-02-03 MED ORDER — ALUM & MAG HYDROXIDE-SIMETH 200-200-20 MG/5ML PO SUSP
15.0000 mL | Freq: Four times a day (QID) | ORAL | Status: DC | PRN
  Administered 2022-02-03: 15 mL via ORAL
  Filled 2022-02-03: qty 30

## 2022-02-03 MED ORDER — SODIUM PHOSPHATES 45 MMOLE/15ML IV SOLN: 30.0000 mmol | Freq: Once | INTRAVENOUS | Status: DC

## 2022-02-03 NOTE — Progress Notes (Signed)
Lab reported electrolyte abnormalities. Labs were recollected and indicated in epic. Notified Randol Kern  NP in person and received orders to replace.  Will continue to monitor.

## 2022-02-03 NOTE — Progress Notes (Signed)
Progress Note    Heather Kaiser  NLG:[id number] DOB: 05/22/1990  DOA: 02/02/2022 PCP: Ellene Route      Brief Narrative:    Medical records reviewed and are as summarized below:  Heather Kaiser is a 31 y.o. female with alcohol use disorder who presented to the hospital with intractable nausea and vomiting and subsequent hematemesis.      Assessment/Plan:   Principal Problem:   Hematemesis Active Problems:   Alcohol use disorder, moderate, dependence (HCC)   Alcoholic ketoacidosis   Alcohol withdrawal syndrome (HCC)   AKI (acute kidney injury) (HCC)   Elevated lipase   Hypercalcemia   Hypomagnesemia   Hypophosphatemia    Body mass index is 25.65 kg/m.   Nausea and vomiting, hematemesis: Antiemetics as needed.  Continue IV Protonix.  H&H is stable.  AKI and hypercalcemia: Improved.  Discontinue IV fluids  Hypokalemia, hypomagnesemia and hypophosphatemia: Potassium is 3.0, magnesium is 1.7 and phosphorus less than 1.  Replete phosphorus with IV potassium phosphate.  Use IV and oral potassium to replete potassium.  Continue oral magnesium oxide and add IV magnesium sulfate.  Replete electrolytes and correct as needed.  Alcohol use disorder with alcohol withdrawal syndrome: Use Ativan as needed per CIWA protocol.  Alcoholic ketoacidosis: Improved   Diet Order             Diet regular Room service appropriate? Yes; Fluid consistency: Thin  Diet effective now                            Consultants: None  Procedures: None    Medications:    folic acid  1 mg Oral Daily   folic acid  1 mg Intravenous Daily   LORazepam  0-4 mg Intravenous Q6H   Or   LORazepam  0-4 mg Oral Q6H   [START ON 02/04/2022] LORazepam  0-4 mg Intravenous Q12H   Or   [START ON 02/04/2022] LORazepam  0-4 mg Oral Q12H   magnesium oxide  400 mg Oral BID   multivitamin with minerals  1 tablet Oral Daily   [START ON 02/05/2022] pantoprazole  40 mg Intravenous  Q12H   thiamine (VITAMIN B1) injection  100 mg Intravenous Daily   Continuous Infusions:     Anti-infectives (From admission, onward)    Start     Dose/Rate Route Frequency Ordered Stop   02/02/22 0130  cefTRIAXone (ROCEPHIN) 1 g in sodium chloride 0.9 % 100 mL IVPB        1 g 200 mL/hr over 30 Minutes Intravenous  Once 02/02/22 0128 02/02/22 0225              Family Communication/Anticipated D/C date and plan/Code Status   DVT prophylaxis:      Code Status: Full Code  Family Communication: None Disposition Plan: Plan to discharge home tomorrow   Status is: Inpatient Remains inpatient appropriate because: Electrolyte abnormalities requiring replacement and monitoring       Subjective:   Interval events noted.  She complains of heartburn.  She says she vomited once this morning but it was nonbloody.  Objective:    Vitals:   02/03/22 0028 02/03/22 0502 02/03/22 0818 02/03/22 1154  BP: 131/88 (!) 143/97 121/78 (!) 140/87  Pulse: 91 87 90 88  Resp: '20 20 17 17  '$ Temp: 98.9 F (37.2 C) 97.8 F (36.6 C) 98.2 F (36.8 C) 97.8 F (36.6 C)  TempSrc:  SpO2: 99% 100% 99% 100%  Weight:      Height:       No data found.   Intake/Output Summary (Last 24 hours) at 02/03/2022 1656 Last data filed at 02/03/2022 1550 Gross per 24 hour  Intake 4105.62 ml  Output --  Net 4105.62 ml   Filed Weights   02/02/22 0059 02/02/22 2123  Weight: 85 kg 85.8 kg    Exam:  GEN: NAD SKIN: No rash EYES: EOMI ENT: MMM CV: RRR PULM: CTA B ABD: soft, ND, NT, +BS CNS: AAO x 3, non focal EXT: No edema or tenderness        Data Reviewed:   I have personally reviewed following labs and imaging studies:  Labs: Labs show the following:   Basic Metabolic Panel: Recent Labs  Lab 02/02/22 0107 02/02/22 0322 02/03/22 0424  NA 139  --  137  K 4.2  --  3.0*  CL 84*  --  99  CO2 15*  --  31  GLUCOSE 148*  --  101*  BUN 27*  --  15  CREATININE 1.95*   --  0.75  CALCIUM 11.3*  --  8.6*  MG 1.5*  --  1.7  PHOS  --  2.2* <1.0*   GFR Estimated Creatinine Clearance: 120.8 mL/min (by C-G formula based on SCr of 0.75 mg/dL). Liver Function Tests: Recent Labs  Lab 02/02/22 0107 02/03/22 0424  AST 130* 51*  ALT 121* 55*  ALKPHOS 99 57  BILITOT 2.1* 1.2  PROT 10.6* 6.5  ALBUMIN 5.1* 3.6   Recent Labs  Lab 02/02/22 0107  LIPASE 139*   No results for input(s): "AMMONIA" in the last 168 hours. Coagulation profile Recent Labs  Lab 02/02/22 0107  INR 1.1    CBC: Recent Labs  Lab 02/02/22 0107 02/02/22 0752 02/02/22 0755 02/02/22 2133 02/03/22 0137 02/03/22 0802 02/03/22 1253  WBC 15.1*  --   --   --  9.9  --   --   NEUTROABS 13.1*  --   --   --  7.7  --   --   HGB 14.8   < > 10.7* 11.6* 10.5* 10.9* 11.5*  HCT 46.1*   < > 32.8* 36.4 31.9* 33.4* 36.1  MCV 87.3  --   --   --  85.5  --   --   PLT 281  --   --   --  149*  --   --    < > = values in this interval not displayed.   Cardiac Enzymes: No results for input(s): "CKTOTAL", "CKMB", "CKMBINDEX", "TROPONINI" in the last 168 hours. BNP (last 3 results) No results for input(s): "PROBNP" in the last 8760 hours. CBG: No results for input(s): "GLUCAP" in the last 168 hours. D-Dimer: No results for input(s): "DDIMER" in the last 72 hours. Hgb A1c: No results for input(s): "HGBA1C" in the last 72 hours. Lipid Profile: No results for input(s): "CHOL", "HDL", "LDLCALC", "TRIG", "CHOLHDL", "LDLDIRECT" in the last 72 hours. Thyroid function studies: No results for input(s): "TSH", "T4TOTAL", "T3FREE", "THYROIDAB" in the last 72 hours.  Invalid input(s): "FREET3" Anemia work up: No results for input(s): "VITAMINB12", "FOLATE", "FERRITIN", "TIBC", "IRON", "RETICCTPCT" in the last 72 hours. Sepsis Labs: Recent Labs  Lab 02/02/22 0107 02/03/22 0137  WBC 15.1* 9.9    Microbiology No results found for this or any previous visit (from the past 240  hour(s)).  Procedures and diagnostic studies:  CT ABDOMEN PELVIS WO CONTRAST  Result Date: 02/02/2022 CLINICAL DATA:  31 year old female with nausea vomiting. Hematemesis. Requesting ETOH detox. EXAM: CT ABDOMEN AND PELVIS WITHOUT CONTRAST TECHNIQUE: Multidetector CT imaging of the abdomen and pelvis was performed following the standard protocol without IV contrast. RADIATION DOSE REDUCTION: This exam was performed according to the departmental dose-optimization program which includes automated exposure control, adjustment of the mA and/or kV according to patient size and/or use of iterative reconstruction technique. COMPARISON:  Abdomen ultrasound 08/24/2013. FINDINGS: Lower chest: Mild lung base atelectasis. No pericardial or pleural effusion. Circumferential wall thickening of the distal esophagus up to 15 mm (series 2, image 6). Probable small adjacent posterior mediastinal lymph nodes. No strong evidence of paraesophageal varices on this noncontrast exam. Hepatobiliary: Hepatic steatosis. No perihepatic fluid. Negative gallbladder. Pancreas: Negative noncontrast pancreas. Spleen: Negative, no splenomegaly. Adrenals/Urinary Tract: Normal adrenal glands. Punctate nephrolithiasis mostly on the right. No hydronephrosis or hydroureter. Both ureters appear negative to the bladder. Left pelvic phleboliths. Unremarkable bladder. Stomach/Bowel: Mostly decompressed large bowel. Occasional hyperdense stool or stool mixed with oral contrast. Diminutive appendix suspected on coronal image 29. Decompressed terminal ileum. No dilated small bowel. Thickening of the distal esophagus, but gastroesophageal junction, stomach, and duodenum appear negative. No free air or free fluid. Vascular/Lymphatic: Normal caliber abdominal aorta. No calcified atherosclerosis or lymphadenopathy identified. Reproductive: Negative noncontrast appearance. Other: No pelvic free fluid. Musculoskeletal: No acute osseous abnormality identified.  IMPRESSION: 1. Circumferential wall thickening of the distal esophagus suspicious for Esophagitis. 2. Hepatic Steatosis, but no strong evidence of portal venous hypertension or paraesophageal varices on this noncontrast exam. 3. No other acute or inflammatory process identified in the noncontrast abdomen or pelvis. Punctate right nephrolithiasis. Electronically Signed   By: Genevie Ann M.D.   On: 02/02/2022 05:56               LOS: 1 day   Allena Pietila  [facility]   Pager on [email or web address]. If 7PM-7AM, please contact night-coverage at [email or web address]     02/03/2022, 4:56 PM

## 2022-02-03 NOTE — TOC Progression Note (Signed)
Transition of Care [facility]) - Progression Note    Patient Details  Name: Heather Kaiser MRN: [id number] Date of Birth: [birth year]  [facility] CM/SW Contact  Izola Price, RN Phone Number: 02/03/2022, 3:26 PM  Clinical Narrative: Unit RN notified RN CM that patient interested in inpatient rehab for ETOH detox. Added resources to AVS and printed local AA and resources to unit for patient to study. Simmie Davies RN CM             Expected Discharge Plan and Services                                                 Social Determinants of Health (SDOH) Interventions    Readmission Risk Interventions     No data to display

## 2022-02-04 DIAGNOSIS — N179 Acute kidney failure, unspecified: Secondary | ICD-10-CM | POA: Diagnosis not present

## 2022-02-04 DIAGNOSIS — F10930 Alcohol use, unspecified with withdrawal, uncomplicated: Secondary | ICD-10-CM | POA: Diagnosis not present

## 2022-02-04 DIAGNOSIS — K920 Hematemesis: Secondary | ICD-10-CM | POA: Diagnosis not present

## 2022-02-04 DIAGNOSIS — E8729 Other acidosis: Secondary | ICD-10-CM | POA: Diagnosis not present

## 2022-02-04 DIAGNOSIS — E876 Hypokalemia: Secondary | ICD-10-CM

## 2022-02-04 LAB — CBC
HCT: 36.2 % (ref 36.0–46.0)
Hemoglobin: 11.9 g/dL — ABNORMAL LOW (ref 12.0–15.0)
MCH: 28.9 pg (ref 26.0–34.0)
MCHC: 32.9 g/dL (ref 30.0–36.0)
MCV: 87.9 fL (ref 80.0–100.0)
Platelets: 136 10*3/uL — ABNORMAL LOW (ref 150–400)
RBC: 4.12 MIL/uL (ref 3.87–5.11)
RDW: 14.5 % (ref 11.5–15.5)
WBC: 5.4 10*3/uL (ref 4.0–10.5)
nRBC: 0 % (ref 0.0–0.2)

## 2022-02-04 LAB — BASIC METABOLIC PANEL
Anion gap: 8 (ref 5–15)
BUN: 8 mg/dL (ref 6–20)
CO2: 25 mmol/L (ref 22–32)
Calcium: 8.5 mg/dL — ABNORMAL LOW (ref 8.9–10.3)
Chloride: 104 mmol/L (ref 98–111)
Creatinine, Ser: 0.59 mg/dL (ref 0.44–1.00)
GFR, Estimated: >60 mL/min (ref >60)
Glucose, Bld: 98 mg/dL (ref 70–99)
Potassium: 3.6 mmol/L (ref 3.5–5.1)
Sodium: 137 mmol/L (ref 135–145)

## 2022-02-04 LAB — MAGNESIUM: Magnesium: 2.1 mg/dL (ref 1.7–2.4)

## 2022-02-04 LAB — PHOSPHORUS: Phosphorus: 2.6 mg/dL (ref 2.5–4.6)

## 2022-02-04 NOTE — Discharge Summary (Addendum)
Physician Discharge Summary   Patient: Heather Kaiser MRN: [id number] DOB: 20-Apr-1991  Admit date:     02/02/2022  Discharge date: 02/04/22  Discharge Physician: Jennye Boroughs   PCP: Ellene Route   Recommendations at discharge:   Avoid alcohol. Follow-up with PCP in 1 to 2 weeks Patient was provided with resources for rehab for alcohol use disorder.  She has been encouraged to follow-up.  Discharge Diagnoses: Principal Problem:   Hematemesis Active Problems:   Alcohol use disorder, moderate, dependence (HCC)   Alcoholic ketoacidosis   Alcohol withdrawal syndrome (HCC)   AKI (acute kidney injury) (HCC)   Elevated lipase   Hypercalcemia   Hypomagnesemia   Hypophosphatemia   Hypokalemia  Resolved Problems:   * No resolved hospital problems. *  Hospital Course:  Heather Kaiser is a 31 y.o. female with alcohol use disorder, GERD on Omeprazole, who presented to the hospital with intractable nausea and vomiting and subsequent hematemesis.  Hematemesis was probably from Mallory-Weiss tear versus esophagitis secondary to GERD.  She was also found to have alcoholic ketoacidosis, acute kidney injury, hypercalcemia, hypokalemia, hypomagnesemia and severe hypophosphatemia.  She was treated with IV fluids, IV Protonix and electrolytes were repleted orally and intravenously.  She was also given IV Ativan for alcohol withdrawal syndrome.  Her condition has improved significantly.  Abnormal electrolytes have resolved.  She is deemed stable for discharge to home today.  She has been advised to avoid alcohol and follow-up closely with PCP for routine health maintenance.       Consultants: None Procedures performed: None  Disposition: Home Diet recommendation:  Discharge Diet Orders (From admission, onward)     Start     Ordered   02/04/22 0000  Diet - low sodium heart healthy        02/04/22 0836           Cardiac diet DISCHARGE MEDICATION: Allergies as of 02/04/2022   No  Known Allergies      Medication List     TAKE these medications    drospirenone-ethinyl estradiol 3-0.03 MG tablet Commonly known as: Syeda TAKE 1 TABLET BY MOUTH DAILY. CONTINUOUS DOSING   PriLOSEC OTC 20 MG tablet Generic drug: omeprazole Take 1 tablet (20 mg total) by mouth daily.        Discharge Exam: Filed Weights   02/02/22 0059 02/02/22 2123  Weight: 85 kg 85.8 kg   GEN: NAD SKIN: No rash on exposed skin EYES: EOMI ENT: MMM CV: RRR PULM: CTA B ABD: soft, ND, NT, +BS CNS: AAO x 3, non focal EXT: No edema or tenderness   Condition at discharge: good  The results of significant diagnostics from this hospitalization (including imaging, microbiology, ancillary and laboratory) are listed below for reference.   Imaging Studies: CT ABDOMEN PELVIS WO CONTRAST  Result Date: 02/02/2022 CLINICAL DATA:  31 year old female with nausea vomiting. Hematemesis. Requesting ETOH detox. EXAM: CT ABDOMEN AND PELVIS WITHOUT CONTRAST TECHNIQUE: Multidetector CT imaging of the abdomen and pelvis was performed following the standard protocol without IV contrast. RADIATION DOSE REDUCTION: This exam was performed according to the departmental dose-optimization program which includes automated exposure control, adjustment of the mA and/or kV according to patient size and/or use of iterative reconstruction technique. COMPARISON:  Abdomen ultrasound 08/24/2013. FINDINGS: Lower chest: Mild lung base atelectasis. No pericardial or pleural effusion. Circumferential wall thickening of the distal esophagus up to 15 mm (series 2, image 6). Probable small adjacent posterior mediastinal lymph nodes. No strong evidence of paraesophageal varices  on this noncontrast exam. Hepatobiliary: Hepatic steatosis. No perihepatic fluid. Negative gallbladder. Pancreas: Negative noncontrast pancreas. Spleen: Negative, no splenomegaly. Adrenals/Urinary Tract: Normal adrenal glands. Punctate nephrolithiasis mostly on  the right. No hydronephrosis or hydroureter. Both ureters appear negative to the bladder. Left pelvic phleboliths. Unremarkable bladder. Stomach/Bowel: Mostly decompressed large bowel. Occasional hyperdense stool or stool mixed with oral contrast. Diminutive appendix suspected on coronal image 29. Decompressed terminal ileum. No dilated small bowel. Thickening of the distal esophagus, but gastroesophageal junction, stomach, and duodenum appear negative. No free air or free fluid. Vascular/Lymphatic: Normal caliber abdominal aorta. No calcified atherosclerosis or lymphadenopathy identified. Reproductive: Negative noncontrast appearance. Other: No pelvic free fluid. Musculoskeletal: No acute osseous abnormality identified. IMPRESSION: 1. Circumferential wall thickening of the distal esophagus suspicious for Esophagitis. 2. Hepatic Steatosis, but no strong evidence of portal venous hypertension or paraesophageal varices on this noncontrast exam. 3. No other acute or inflammatory process identified in the noncontrast abdomen or pelvis. Punctate right nephrolithiasis. Electronically Signed   By: Genevie Ann M.D.   On: 02/02/2022 05:56    Microbiology: Results for orders placed or performed in visit on 05/07/17  Chlamydia/Gonococcus/Trichomonas, NAA     Status: None   Collection Time: 05/07/17  4:03 PM   Specimen: Cervical / Endocervical swab; Genital   CE  Result Value Ref Range Status   Chlamydia by NAA Negative Negative Final   Gonococcus by NAA Negative Negative Final   Trich vag by NAA Negative Negative Final    Labs: CBC: Recent Labs  Lab 02/02/22 0107 02/02/22 0752 02/02/22 2133 02/03/22 0137 02/03/22 0802 02/03/22 1253 02/04/22 0503  WBC 15.1*  --   --  9.9  --   --  5.4  NEUTROABS 13.1*  --   --  7.7  --   --   --   HGB 14.8   < > 11.6* 10.5* 10.9* 11.5* 11.9*  HCT 46.1*   < > 36.4 31.9* 33.4* 36.1 36.2  MCV 87.3  --   --  85.5  --   --  87.9  PLT 281  --   --  149*  --   --  136*   < >  = values in this interval not displayed.   Basic Metabolic Panel: Recent Labs  Lab 02/02/22 0107 02/02/22 0322 02/03/22 0424 02/03/22 1844 02/04/22 0503  NA 139  --  137  --  137  K 4.2  --  3.0* 3.4* 3.6  CL 84*  --  99  --  104  CO2 15*  --  31  --  25  GLUCOSE 148*  --  101*  --  98  BUN 27*  --  15  --  8  CREATININE 1.95*  --  0.75  --  0.59  CALCIUM 11.3*  --  8.6*  --  8.5*  MG 1.5*  --  1.7 2.3 2.1  PHOS  --  2.2* <1.0* <1.0* 2.6   Liver Function Tests: Recent Labs  Lab 02/02/22 0107 02/03/22 0424  AST 130* 51*  ALT 121* 55*  ALKPHOS 99 57  BILITOT 2.1* 1.2  PROT 10.6* 6.5  ALBUMIN 5.1* 3.6   CBG: No results for input(s): "GLUCAP" in the last 168 hours.  Discharge time spent: less than 30 minutes.  Signed: Jennye Boroughs, MD [facility] 02/04/2022

## 2022-02-05 ENCOUNTER — Ambulatory Visit (INDEPENDENT_AMBULATORY_CARE_PROVIDER_SITE_OTHER): Payer: 59 | Admitting: Obstetrics & Gynecology

## 2022-02-05 ENCOUNTER — Other Ambulatory Visit (HOSPITAL_COMMUNITY)
Admission: RE | Admit: 2022-02-05 | Discharge: 2022-02-05 | Disposition: A | Discharge status: Home / Self Care | Payer: 59 | Source: Ambulatory Visit | Attending: Obstetrics & Gynecology | Admitting: Obstetrics & Gynecology

## 2022-02-05 ENCOUNTER — Telehealth (HOSPITAL_COMMUNITY): Payer: Self-pay | Admitting: Psychiatry

## 2022-02-05 ENCOUNTER — Encounter: Payer: Self-pay | Admitting: Obstetrics & Gynecology

## 2022-02-05 VITALS — BP 120/80 | Ht 72.0 in | Wt 183.0 lb

## 2022-02-05 DIAGNOSIS — R87612 Low grade squamous intraepithelial lesion on cytologic smear of cervix (LGSIL): Secondary | ICD-10-CM | POA: Diagnosis not present

## 2022-02-05 DIAGNOSIS — N870 Mild cervical dysplasia: Secondary | ICD-10-CM | POA: Diagnosis not present

## 2022-02-05 NOTE — Progress Notes (Signed)
   Established Patient Office Visit  Subjective   Patient ID: Mar Zettler, female    DOB: 1991-04-23  Age: 31 y.o. MRN: [id number]  Chief Complaint  Patient presents with   Colposcopy    HPI    31 yo single G0 here for a colpo due to a LGSIL pap. Her pap in 2020 was normal.  Objective:     BP 120/80   Ht 6' (1.829 m)   Wt 183 lb (83 kg)   LMP 02/02/2022   BMI 24.82 kg/m    Physical Exam Well nourished, well hydrated White female, no apparent distress She is ambulating normally. She was teary at times. We discussed her recent admission to [facility] for Alcohol detox with Mack Guise tear. UPT negative at her recent admission and no sex since,  consent signed, time out done Cervix prepped with acetic acid. Transformation zone seen in its entirety. Colpo adequate. 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 I biopsied this area and used silver nitrate to achieve hemostasis. ECC obtained. She tolerated the procedure well.     Assessment & Plan:   Problem List Items Addressed This Visit   None Visit Diagnoses     LGSIL on Pap smear of cervix    -  Primary     - await pathology results. I will notify her of the results and management plan via Mychart next week. I discussed what a LEEP is if she needs one.  Alcohol abuse- she will call [facility] today to schedule her intense outpatient management of this issue. We discussed how very important this is.  No follow-ups on file.    Emily Filbert, MD

## 2022-02-05 NOTE — Telephone Encounter (Signed)
D:  Pt left vm requesting the case manager return her call re: IOP.  A: Placed call to pt in order to orient her.  Pt states she just left from detox (ETOH) yesterday and is requesting CD-IOP.  Pt has [employer].  Informed pt when CD-IOP meets and who the group leader is.  Will request Bill Garott, LCSW, LCAS to call pt.  R:  Pt receptive.

## 2022-02-06 ENCOUNTER — Telehealth (HOSPITAL_COMMUNITY): Payer: Self-pay | Admitting: Licensed Clinical Social Worker

## 2022-02-06 LAB — SURGICAL PATHOLOGY

## 2022-02-06 NOTE — Telephone Encounter (Signed)
The therapist calls Heather Kaiser confirming her identify via two identifiers. He spoke with her by phone at the beginning of August.  She went to detox as she was "throwing up blood" noting that it was "kind of scary." He was admitted on Thursday at 1 a.m. and left a.m.a. on Sunday morning, 02/04/22. She says that she is feeling better physically.  The therapist provides her with an overview of the program and answers her questions. [name] says that she cannot come in today but schedules a CCA with this therapist tomorrow, 02/07/22, at 1 p.m.   She is provided with this therapist's direct callback number.  Adam Phenix, [place], LCSW, [facility], [place] 02/06/2022

## 2022-02-07 ENCOUNTER — Ambulatory Visit (INDEPENDENT_AMBULATORY_CARE_PROVIDER_SITE_OTHER): Payer: 59 | Admitting: Licensed Clinical Social Worker

## 2022-02-07 ENCOUNTER — Encounter: Payer: Self-pay | Admitting: Obstetrics & Gynecology

## 2022-02-07 ENCOUNTER — Encounter (HOSPITAL_COMMUNITY): Payer: Self-pay

## 2022-02-07 DIAGNOSIS — F900 Attention-deficit hyperactivity disorder, predominantly inattentive type: Secondary | ICD-10-CM

## 2022-02-07 DIAGNOSIS — F1020 Alcohol dependence, uncomplicated: Secondary | ICD-10-CM

## 2022-02-07 DIAGNOSIS — R69 Illness, unspecified: Secondary | ICD-10-CM | POA: Diagnosis not present

## 2022-02-07 DIAGNOSIS — F32A Depression, unspecified: Secondary | ICD-10-CM

## 2022-02-07 DIAGNOSIS — F502 Bulimia nervosa: Secondary | ICD-10-CM
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
## 2022-02-07 NOTE — Plan of Care (Signed)
  Problem:  Alcohol Use Disorder Goal:  Heather Kaiser will abstain from drinking alcohol 7/7 days per week based on self-report and weekly UDS.  Outcome: Not Applicable Goal: Heather Kaiser's symptoms of depression will improve as evidenced by her PHQ-9 dropping from a 19 to a 4 or less.  Outcome: Not Applicable Goal: Heather Kaiser's Bulimia will remain in remission as it has been for over the past six months.  Outcome: Not Applicable Intervention: Therapist will help Heather Kaiser learn to identify and avoid triggers to drink while being able to use healthy coping strategies.  Note: Reviewed with Heather Kaiser Intervention: Therapist will monitor Heather Kaiser's level of depression and monitor for any recurrence of bulimia and utilize CBT interventions as needed. Note: Reviewed with [name]

## 2022-02-07 NOTE — Plan of Care (Signed)
Heather Kaiser gives therapist permission to sign for her electronically.  Problem:  Alcohol Use Disorder Goal:  Heather Kaiser will abstain from drinking alcohol 7/7 days per week based on self-report and weekly UDS.  Outcome: Not Applicable Goal: Heather Kaiser's symptoms of depression will improve as evidenced by her PHQ-9 dropping from a 19 to a 4 or less.  Outcome: Not Applicable Goal: Heather Kaiser's Bulimia will remain in remission as it has been for over the past six months.  Outcome: Not Applicable Intervention: Therapist will help Heather Kaiser learn to identify and avoid triggers to drink while being able to use healthy coping strategies.  Note: Reviewed with Heather Kaiser Intervention: Therapist will monitor Heather Kaiser's level of depression and monitor for any recurrence of bulimia and utilize CBT interventions as needed. Note: Reviewed with [name]

## 2022-02-07 NOTE — Progress Notes (Signed)
Bulimia Did a fitness show in 2015 Binge eat and felt guilty Has been over 6 months Once a day to four or five times per day depending on stress  2020 cutting self called   Lexapro 2019 anxiety more than depression Drinking bad 2020  College dating 2nd break-ups trigger me Expectations dad did not give put on the men

## 2022-02-09 ENCOUNTER — Other Ambulatory Visit (HOSPITAL_COMMUNITY): Payer: 59 | Attending: Medical | Admitting: Licensed Clinical Social Worker

## 2022-02-09 DIAGNOSIS — F419 Anxiety disorder, unspecified: Secondary | ICD-10-CM | POA: Diagnosis not present

## 2022-02-09 DIAGNOSIS — F329 Major depressive disorder, single episode, unspecified: Secondary | ICD-10-CM | POA: Insufficient documentation

## 2022-02-09 DIAGNOSIS — R69 Illness, unspecified: Secondary | ICD-10-CM | POA: Diagnosis not present

## 2022-02-09 DIAGNOSIS — F1020 Alcohol dependence, uncomplicated: Secondary | ICD-10-CM

## 2022-02-09 NOTE — Progress Notes (Signed)
Daily Group Progress Note   Program: CD IOP     Group Time: 9 a.m. to 12 p.m.   Type of Therapy: Process and Psychoeducational    Topic: The therapist checks in with group members, assesses for SI/HI/psychosis and overall level of functioning. The therapist inquires about sobriety date and number of community support meetings attended since last session.    The therapist has group members introduce themselves and talk about how they came to be in IOP as there are two number members in group today.   The therapist facilitates discussions on how to find and Sponsor, how grief typically leads to irrational guilt, and on the fact that even drugs without severe withdrawal can be addiction based on changes to the brain and neurotransmitters in the body which lead to cravings to use. The therapist answers group member's questions about various drugs.   The therapist reads the Just for Today NA reading on having gratitude for one's recovery viewing it as a gift which needed to be care for. The therapist notes that most addicts never receive treatment and that even some with treatment end up dying from the disease.    Summary: Heather Kaiser presents for group rating her depression as a "6" and anxiety as a "6" with no SI or HI.    She describes her mood as "hopeful" and "fragile." She says that she attended three meetings yesterday and that she has a Publishing copy. She notes that although she has been in [place] off and on over the last two years that she likely has less sobriety than most of the people in the room. She talks about her father's alcoholism and problems between her sister and father that caused her to be unable to focus and eventually have to drop out of school.  [name] shares with group about her recent detox admission noting that she could have died and that she now will do "anything" to stop drinking. At the conclusion of group, she says that she found that she got much more out of a professionally-led  group than an AA meeting as people could discuss their issues. The therapist notes that there is much less in depth discussion about individual issues at [facility] meetings but that people can get a lot of this via one-on-one meetings with their Sponsor or others in the program.    Progress Towards Goals: [name] no alcohol or marijuana use   UDS collected: No Results: No   AA/NA attended?: Yes   Sponsor?: Yes   Adam Phenix, [place], [place], [facility], [place] 02/09/2022

## 2022-02-12 ENCOUNTER — Telehealth: Payer: Self-pay

## 2022-02-12 ENCOUNTER — Encounter (HOSPITAL_COMMUNITY): Payer: Self-pay | Admitting: Medical

## 2022-02-12 ENCOUNTER — Other Ambulatory Visit (HOSPITAL_COMMUNITY): Payer: 59 | Attending: Psychiatry | Admitting: Licensed Clinical Social Worker

## 2022-02-12 VITALS — BP 122/82 | HR 80 | Ht 72.0 in | Wt 183.0 lb

## 2022-02-12 DIAGNOSIS — F32A Depression, unspecified: Secondary | ICD-10-CM | POA: Diagnosis not present

## 2022-02-12 DIAGNOSIS — M419 Scoliosis, unspecified: Secondary | ICD-10-CM | POA: Diagnosis not present

## 2022-02-12 DIAGNOSIS — F39 Unspecified mood [affective] disorder: Secondary | ICD-10-CM

## 2022-02-12 DIAGNOSIS — F509 Eating disorder, unspecified: Secondary | ICD-10-CM | POA: Diagnosis not present

## 2022-02-12 DIAGNOSIS — Z8659 Personal history of other mental and behavioral disorders: Secondary | ICD-10-CM | POA: Insufficient documentation

## 2022-02-12 DIAGNOSIS — F10280 Alcohol dependence with alcohol-induced anxiety disorder: Secondary | ICD-10-CM

## 2022-02-12 DIAGNOSIS — Z6372 Alcoholism and drug addiction in family: Secondary | ICD-10-CM | POA: Diagnosis not present

## 2022-02-12 DIAGNOSIS — R69 Illness, unspecified: Secondary | ICD-10-CM | POA: Diagnosis not present

## 2022-02-12 DIAGNOSIS — F4312 Post-traumatic stress disorder, chronic: Secondary | ICD-10-CM | POA: Diagnosis not present

## 2022-02-12 DIAGNOSIS — Z6824 Body mass index (BMI) 24.0-24.9, adult: Secondary | ICD-10-CM | POA: Diagnosis not present

## 2022-02-12 DIAGNOSIS — X838XXD Intentional self-harm by other specified means, subsequent encounter: Secondary | ICD-10-CM

## 2022-02-12 DIAGNOSIS — K2921 Alcoholic gastritis with bleeding: Secondary | ICD-10-CM | POA: Insufficient documentation

## 2022-02-12 DIAGNOSIS — F502 Bulimia nervosa: Secondary | ICD-10-CM | POA: Insufficient documentation

## 2022-02-12 DIAGNOSIS — Z79899 Other long term (current) drug therapy: Secondary | ICD-10-CM | POA: Insufficient documentation

## 2022-02-12 DIAGNOSIS — K219 Gastro-esophageal reflux disease without esophagitis: Secondary | ICD-10-CM | POA: Diagnosis not present

## 2022-02-12 DIAGNOSIS — F1020 Alcohol dependence, uncomplicated: Secondary | ICD-10-CM | POA: Diagnosis present
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
## 2022-02-12 NOTE — Progress Notes (Signed)
Daily Group Progress Note   Program: CD IOP     Group Time: 9 a.m. to 12 p.m.   Type of Therapy: Process and Psychoeducational    Topic: The therapist checks in with group members, assesses for SI/HI/psychosis and overall level of functioning. The therapist inquires about sobriety date and number of community support meetings attended since last session.    The therapist facilitates discussions concerning people revealing to people outside the program that they are an addict or an alcoholic. The therapist also emphasizes that not just people who have been wronged but those who have done wrong need support as well. He discusses strategies for smoking cessation and reminds group members about the [employer] information posted on the doors to the IOP group room.   He shows a video which illustrates that addiction is not a character defect covering how drugs and alcohol impact the brain and reduce D2 Dopamine receptors. The therapist also explains about addiction's impact on people's prefrontal cortex introducing this information to explain the need for people in recovery to focus on being smart and not strong with RP 13 Be Smart Not Strong being the module which the group is currently about to start.   The therapist informs group that marijuana is a drug and that the fact that it grows naturally in fields does not in any way make it not a drug. The therapist explains that heroin is made from poppies that grow in fields, that alcohol be made from barley and hops which grow in fields, etcetera. The therapist also notes that the extremely high THC levels currently found in pot plants is the result of human intervention aimed at increasing these levels.    Summary: [name] presents for group rating her depression as a "2" and anxiety as a "3" with no SI or HI.    She describes her mood as "joyful" and "optimistic."  She attended her best friend's baby's first birthday noting that it is the first event  she has attended sober in a long time.  She attended three meetings over the weekend. She notes that she has had a headache the past several days or so. She is agreeable to having one of the Nurses take her BP which is 124/85. She says that she is large-breasted which causes a strain on her back which is likely causing the headaches saying that she is planning on getting a breast reduction in the future.   [name] is quiet but attentive throughout most of group and has a notebook in which she takes notes. She provides feedback to one group member in sharing her opinion that meeting with one's Sponsor does not count as a "meeting" in as attending a Twelve Step meeting. She provides feedback to another group member concerning how he should proceed after his wife found text messages on his phone as he had been communicating with an ex-girlfriend.    Progress Towards Goals: [name] denies any alcohol use.    UDS collected: Yes Results: No   AA/NA attended?: Yes   Sponsor?: Yes   Adam Phenix, [place], [place], [facility], [place] 02/12/2022

## 2022-02-12 NOTE — Telephone Encounter (Signed)
Pt calling for results from bx last Monday.  [phone]

## 2022-02-13 ENCOUNTER — Encounter: Payer: Self-pay | Admitting: Obstetrics & Gynecology

## 2022-02-14 ENCOUNTER — Other Ambulatory Visit (HOSPITAL_COMMUNITY): Payer: 59 | Attending: Psychiatry | Admitting: Licensed Clinical Social Worker

## 2022-02-14 DIAGNOSIS — R69 Illness, unspecified: Secondary | ICD-10-CM | POA: Diagnosis not present

## 2022-02-14 DIAGNOSIS — F1020 Alcohol dependence, uncomplicated: Secondary | ICD-10-CM | POA: Insufficient documentation

## 2022-02-14 NOTE — Progress Notes (Signed)
Daily Group Progress Note   Program: CD IOP     Group Time: 9 a.m. to 12 p.m.   Type of Therapy: Process and Psychoeducational    Topic: The therapist checks in with group members, assesses for SI/HI/psychosis and overall level of functioning. The therapist inquires about sobriety date and number of community support meetings attended since last session.    The therapist completes Matrix Module RP 13 Be Smart, Not Strong having group members complete the questionnaire in the module and discuss their Recovery IQ scores and how they can work to increase them.    Summary: Heather Kaiser presents for group rating her depression as a "2" and anxiety as a "2" with no SI or HI.    She notes that she has started "buying stuff" now that she is not drinking and needs to slow down on it as she has a big hospital bill to pay.  She describes her mood as being "proud" and "thankful." She notes that September and October are the hardest months for her in terms of staying sober as it is the anniversary of her father's death. She talks about now being thankful for positive memories of him while not talking to her sister who was supposed to move in with their father and "make him stop drinking." The therapist makes the observation that her sister moving in with their father likely would have caused him more stress as they were constantly arguing and points out the fact the no one can make another person stop using drugs or alcohol.  Heather Kaiser has attended three meetings and is going to start working Step One with her Sponsor. She says that she needs to get her sleep schedule straightened out as she used to go to bed at 7:30 p.m. and drink to get back to sleep but now goes to sleep about 9:30 p.m. and gets up around 5:30 a.m.  Her Recovery IQ is a 78. He says that she could increase it by using thought stopping, avoiding triggers, and exercising. She says that she has latched onto the idea of the need to be smart and not  strong having told others in recovery about this.     Progress Towards Goals: Heather Kaiser denies any alcohol use.    UDS collected: No Results: No   AA/NA attended?: Yes   Sponsor?: Yes   Adam Phenix, [place], [place], [facility], [place] 02/14/2022

## 2022-02-16 ENCOUNTER — Other Ambulatory Visit (HOSPITAL_COMMUNITY): Payer: 59 | Attending: Psychiatry | Admitting: Licensed Clinical Social Worker

## 2022-02-16 DIAGNOSIS — F329 Major depressive disorder, single episode, unspecified: Secondary | ICD-10-CM | POA: Diagnosis present

## 2022-02-16 DIAGNOSIS — F1020 Alcohol dependence, uncomplicated: Secondary | ICD-10-CM

## 2022-02-16 DIAGNOSIS — F419 Anxiety disorder, unspecified: Secondary | ICD-10-CM | POA: Diagnosis not present

## 2022-02-16 DIAGNOSIS — R69 Illness, unspecified: Secondary | ICD-10-CM | POA: Diagnosis not present

## 2022-02-16 NOTE — Progress Notes (Signed)
Daily Group Progress Note   Program: CD IOP     Group Time: 9 a.m. to 12 p.m.   Type of Therapy: Process and Psychoeducational    Topic: The therapist checks in with group members, assesses for SI/HI/psychosis and overall level of functioning. The therapist inquires about sobriety date and number of community support meetings attended since last session.   The therapist discusses the importance of assertiveness in relation to self-care which is critical to recovery. The therapist presents Matrix Module RP 14 Defining Spirituality. As group member struggle to come up with specific versus subjective answers to the questions in the module, the therapist provides them the questions as homework to work on over the weekend. He has them add whether or not they have a spiritual practice that gives them hope and meaning and purpose and if so, how often do they do this practice.   The therapist shows a video on Three Ways Spirituality Supports Addiction Recovery.    Summary: Heather Kaiser presents for group rating her depression as a "2" and anxiety as a "2" with no SI or HI.    She describes her mood as "joyful and worried." She is worried about things being "too good" lately so worries about the other shoe dropping. The therapist explains that the other shoe will eventually drop, and then will pick back up, and then drop again, etcetera. He shares the NA Just for Today reading for today involving the need to stay in the here-and-now versus dwelling on the past that no longer exists or the future than has not arrived yet. Heather Kaiser is joyful as she is attending her other best friend's birthday party this weekend.   She talks about how her spirit was broken during the past few years of drinking as she acted out of her character defects. The therapist makes the observation that basically everyone in active addiction is acting out of their character defects and as a result will violate values in which they believe.  Heather Kaiser, like other group members, admits that what she wanted from life when in active addiction was to not feel emotions.   Heather Kaiser admits that the questions posed in the spirituality module left her feeling as though her mind is overloaded.    Progress Towards Goals: Heather Kaiser denies any alcohol use.    UDS collected: No Results: No   AA/NA attended?: Yes   Sponsor?: Yes   Heather Kaiser, [place], [place], [facility], [place] 02/16/2022

## 2022-02-19 ENCOUNTER — Other Ambulatory Visit (HOSPITAL_COMMUNITY): Payer: 59 | Attending: Psychiatry | Admitting: Medical

## 2022-02-19 ENCOUNTER — Encounter (HOSPITAL_COMMUNITY): Payer: Self-pay | Admitting: Medical

## 2022-02-19 DIAGNOSIS — F502 Bulimia nervosa: Secondary | ICD-10-CM

## 2022-02-19 DIAGNOSIS — R69 Illness, unspecified: Secondary | ICD-10-CM | POA: Diagnosis not present

## 2022-02-19 DIAGNOSIS — F1020 Alcohol dependence, uncomplicated: Secondary | ICD-10-CM | POA: Diagnosis present

## 2022-02-19 DIAGNOSIS — F32A Depression, unspecified: Secondary | ICD-10-CM | POA: Diagnosis not present

## 2022-02-19 DIAGNOSIS — K2921 Alcoholic gastritis with bleeding: Secondary | ICD-10-CM

## 2022-02-19 DIAGNOSIS — F419 Anxiety disorder, unspecified: Secondary | ICD-10-CM

## 2022-02-19 DIAGNOSIS — K2101 Gastro-esophageal reflux disease with esophagitis, with bleeding: Secondary | ICD-10-CM | POA: Diagnosis not present

## 2022-02-19 DIAGNOSIS — F4312 Post-traumatic stress disorder, chronic: Secondary | ICD-10-CM

## 2022-02-19 DIAGNOSIS — Z6372 Alcoholism and drug addiction in family: Secondary | ICD-10-CM

## 2022-02-19 DIAGNOSIS — F900 Attention-deficit hyperactivity disorder, predominantly inattentive type: Secondary | ICD-10-CM | POA: Diagnosis not present

## 2022-02-19 NOTE — Progress Notes (Unsigned)
Daily Group Progress Note   Program: CD IOP     Group Time: 9 a.m. to 12 p.m.   Type of Therapy: Process and Psychoeducational    Topic: The therapist checks in with group members, assesses for SI/HI/psychosis and overall level of functioning. The therapist inquires about sobriety date and number of community support meetings attended since last session.   The therapist attempts to review homework from last group; however, as two of the three persons in group did not do the homework; the therapist reassigns it to cover in group on 02/21/22.  The therapist focuses primarily on the need for people in recovery to not isolate and to build sober, non-using supports while at the same time discussing how doing so can be extremely challenging for people new to recovery as they may have issues with trusting people with many people with addiction having early life trauma.  The therapist shows a video, "Everything you think you know about addiction is wrong" with Lucillie Garfinkel emphasizing the importance of social support in recovery.  The therapist suggests that in addiction to Twelve Step meetings that people in recovery can find sober supports by joining hiking groups, book clubs, Armed forces training and education officer.   Summary: [name] presents for group rating her depression as a "1" and anxiety as a "1" with no SI or HI.    She describes her mood today as "shocked and inspired." She attended three meetings and an event at [facility] in which there were a lot of speaker meetings. She says that she was inspired by the stories.  She attended a social event with a friend and was shocked that no drama occurred considering the dynamics in the friend's family.   [name] notes that she had "memories of drinking" when in her room watching Netflix so started cleaning and vacuuming. She says that in the recent past that she spent two months isolating in her room drinking vodka and watching Netflix. The therapist suggests from a sleep hygiene  standpoint that [name] may eventually want to relocate her television outside of her bedroom.   She says that she continues to focus on being smart rather than strong and admits that it gets on her nerves when her friends who drink socially ask her if it is o.k. for them to drink around her.   [name] admits to not trusting people; however, she says that her willingness to do anything to stop drinking has forced her to go to meetings and trust people like her Sponsor. She makes the observation that the people in the program have never given up on her as have many other people in her life.  [name] is the only group member to have completed her homework.   Progress Towards Goals: [name] denies any alcohol use.    UDS collected: Yes Results: Yes; negative for alcohol   AA/NA attended?: Yes   Sponsor?: Yes   Adam Phenix, [place], [place], [facility], [place] 02/19/2022

## 2022-02-19 NOTE — Progress Notes (Signed)
   [facility] Follow-up Outpatient CDIOP Date: 02/19/2022  Admission Date: 02/12/2022  Sobriety date: 01/31/2022  Subjective: "I know (pt responding to provider advising that abstinence can get tougher as time passes in early sobriety)"  HPI : CD IOP Provider FU Pt seen 1 week S/P entry into CD IOP she/her/hers continues to reports no problem with cravings/withdrawal.She has significant AA Exdperience and has reimersed herself in that program as she attends treatment here. She is checking on the [facility] for her ADHD. The only question she has is whteher it will interfere with her OBCP which her Doctor has told her some Psychiatric meds can do.   Review of Systems: Psychiatric: Agitation: denies Hallucination: No Depressed Mood:Heather Kaiser presents for group rating her depression as a "1" and anxiety as a "1" with no SI or HI.  Insomnia: No Hypersomnia: No Altered Concentration: No Feels Worthless: Chronic esteem issues from dysfunctional childhood development in an alcoholic family Grandiose Ideas: No Belief In Special Powers: No New/Increased Substance Abuse: No Compulsions: Not at this time  Neurologic: Headache: No Seizure: No Paresthesias: No  Current Medications: drospirenone-ethinyl estradiol 3-0.03 MG tablet Commonly known as: Syeda TAKE 1 TABLET BY MOUTH DAILY. CONTINUOUS DOSING  PriLOSEC OTC 20 MG tablet Generic drug: omeprazole Take 1 tablet (20 mg total) by mouth daily.    Mental Status Examination  Appearance: Casual Neat Alert: Yes Attention: good  Cooperative: Yes Eye Contact: Good Speech: Clear and coherent Psychomotor Activity: Normal Memory/Concentration: Normal/intact Oriented: person, place, time/date and situation Mood: Euthymic/some anxiety Affect: Appropriate and Congruent Thought Processes and Associations: Coherent and Intact Fund of Knowledge:WDL Thought Content: Logical/Goal Directed ;NO HI/SI/Associations intact Insight:  Lacking Judgement: Impaired/improving  UDS:9/25/223 Clear  PDMP: 000  Diagnosis:  Alcohol use disorder, severe, dependence (HCC) Chronic post-traumatic stress disorder (PTSD) Bulimia nervosa Alcoholic gastritis with hemorrhage, unspecified chronicity Gastroesophageal reflux disease with esophagitis and hemorrhage Alcoholism and drug addiction in family Attention deficit hyperactivity disorder (ADHD), predominantly inattentive type Depressive disorder Anxiety disorder, unspecified type  Assessment: AUD severe dependence Off to a good start in CD IOP   Treatment Plan:Per Admission. FU on [place]ra per HPI FU 2 weeks Sooner if needed     Darlyne Russian, PA-C Patient ID: Heather Kaiser, female   DOB: 1990/10/24, 31 y.o.   MRN: [id number]

## 2022-02-19 NOTE — Progress Notes (Unsigned)
ults

## 2022-02-21 ENCOUNTER — Other Ambulatory Visit (HOSPITAL_COMMUNITY): Payer: 59 | Attending: Psychiatry | Admitting: Licensed Clinical Social Worker

## 2022-02-21 DIAGNOSIS — F1020 Alcohol dependence, uncomplicated: Secondary | ICD-10-CM | POA: Insufficient documentation

## 2022-02-21 DIAGNOSIS — R69 Illness, unspecified: Secondary | ICD-10-CM | POA: Diagnosis not present

## 2022-02-21 NOTE — Progress Notes (Signed)
Daily Group Progress Note   Program: CD IOP     Group Time: 9 a.m. to 12 p.m.   Type of Therapy: Process and Psychoeducational    Topic: The therapist checks in with group members, assesses for SI/HI/psychosis and overall level of functioning. The therapist inquires about sobriety date and number of community support meetings attended since last session.    The therapist has group members complete and discuss the questions in RP 14 Defining Spirituality as the major of group members did not complete this homework.    Summary: Heather Kaiser presents for group rating her depression as a "2" and anxiety as a 21" with no SI or HI.    She describes her mood today as "tired" and "optimistic." She says that she attended two meetings yesterday leaving early from one as she was "exhausted." On Monday, she worked steps one through three with her Sponsor and will be beginning Step 4 which has previously always triggered a relapse.  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  Heather Kaiser seems to apologize for complaining about being tired as she does not have children like some group members and then apologizes again for complaining noting that she worked in the front end or customer service part of [employer] unlike two other group members who work in the kitchen. The therapist points out that Heather Kaiser can complain regardless and need not compare her situation to others.  He suggests that Heather Kaiser may benefit at some point from getting some vocational testing to help her figure out what it is that she would like to do.    Progress Towards Goals: Heather Kaiser denies any alcohol use.    UDS collected: No Results: No   AA/NA attended?: Yes   Sponsor?: Yes   Adam Phenix, [place], [place], [facility], [place] 02/21/2022

## 2022-02-22 ENCOUNTER — Encounter: Payer: Self-pay | Admitting: Obstetrics and Gynecology

## 2022-02-23 ENCOUNTER — Other Ambulatory Visit (HOSPITAL_COMMUNITY): Payer: 59 | Attending: Psychiatry | Admitting: Licensed Clinical Social Worker

## 2022-02-23 DIAGNOSIS — R69 Illness, unspecified: Secondary | ICD-10-CM | POA: Diagnosis not present

## 2022-02-23 DIAGNOSIS — F1020 Alcohol dependence, uncomplicated: Secondary | ICD-10-CM

## 2022-02-23 DIAGNOSIS — F1010 Alcohol abuse, uncomplicated: Secondary | ICD-10-CM | POA: Insufficient documentation

## 2022-02-23 NOTE — Progress Notes (Signed)
Daily Group Progress Note   Program: CD IOP     Group Time: 9 a.m. to 12 p.m.   Type of Therapy: Process and Psychoeducational    Topic: The therapist checks in with group members, assesses for SI/HI/psychosis and overall level of functioning. The therapist inquires about sobriety date and number of community support meetings attended since last session.    The therapist discusses the cross tolerance between benzodiazepines and alcohol and the reason that benzodiazepines are contraindicated for persons with a history of an alcohol use disorder. The therapist also discusses the limited uses for opioid pain medication such as for very short-term use for post surgical pain.   The therapist has group members complete and discuss a Electronics engineer for Establishing a Support System."    Summary: Heather Kaiser presents for group rating her depression as a "1" and anxiety as a "1"" with no SI or HI.    She describes her mood today as ""overthinking" and "worried." She has a surgery scheduled in the future and is fearful about taking prescribed pain medications after the surgery due to concerns about addiction. She says that she has never taken a narcotic pain medication before and notes that she is likely overreacting.  The therapist informs Heather Kaiser that she has the right attitude in exercising caution when it comes to any form of a controlled substance; however, endeavors to alleviate her fears noting that she needs to talk to her doctor about taking them no longer that is absolutely necessary which is likely less than a few days before she can take something like Tylenol. The therapist notes that the problem during the opioid epidemic was doctors writing thirty day supplies of pain medications sometime with refills for conditions that only necessitated a few days of medication.   Group runs out of time before [name] is able to discuss the answers on her worksheet.    Progress Towards Goals: Heather Kaiser denies any  alcohol use.    UDS collected: No Results: Yes; negative for alcohol and drugs   AA/NA attended?: Yes   Sponsor?: Yes   Adam Phenix, [place], [place], [facility], [place] 02/23/2022

## 2022-02-24 ENCOUNTER — Other Ambulatory Visit: Payer: Self-pay | Admitting: Obstetrics and Gynecology

## 2022-02-24 DIAGNOSIS — Z3041 Encounter for surveillance of contraceptive pills: Secondary | ICD-10-CM

## 2022-02-26 ENCOUNTER — Other Ambulatory Visit (HOSPITAL_COMMUNITY): Payer: 59 | Attending: Psychiatry | Admitting: Licensed Clinical Social Worker

## 2022-02-26 DIAGNOSIS — F1020 Alcohol dependence, uncomplicated: Secondary | ICD-10-CM | POA: Diagnosis present

## 2022-02-26 DIAGNOSIS — E8729 Other acidosis: Secondary | ICD-10-CM | POA: Diagnosis not present

## 2022-02-26 DIAGNOSIS — I809 Phlebitis and thrombophlebitis of unspecified site: Secondary | ICD-10-CM | POA: Diagnosis not present

## 2022-02-26 DIAGNOSIS — R69 Illness, unspecified: Secondary | ICD-10-CM | POA: Diagnosis not present

## 2022-02-26 NOTE — Progress Notes (Signed)
Daily Group Progress Note   Program: CD IOP     Group Time: 9 a.m. to 12 p.m.   Type of Therapy: Process and Psychoeducational    Topic: The therapist checks in with group members, assesses for SI/HI/psychosis and overall level of functioning. The therapist inquires about sobriety date and number of community support meetings attended since last session.   The therapist has group members watch the video, "Breaking the Addiction Cycle" having group members consider their beliefs about what they thought an alcohol or addict looked like and to identify and change things associated with their using rituals.   The therapist has one group member discuss her "Worksheet for Establishing a Support System" as she was unable to do so at last group due to time constraints.    Summary: Heather Kaiser presents for group rating her depression as a "1" and anxiety as a "5"" with no SI or HI.    She describes her mood today as "disappointed" but "optimistic." She spends a lot of time discussing how she and others are upset at the behavior of her Sponsor's Sponsor who is apparently causing some drama at her home group via "negative attention seeking." Heather Kaiser says that she got good feedback from others regarding how to handle this noting that she is conflict avoidant.  The therapist suggests that a person can be both assertive and conflict avoidant and that the way that a person speaks up has a lot to do with whether or not conflict might arise.   Heather Kaiser says that she attended three meetings over the weekend and is procrastinating on doing her 4th step. She notes that if she does not reach out to others that there is a 100% chance she will relapse. When another group member talks about having to get rid of her pot stash, Heather Kaiser admits how hard it was for her to get rid of her remaining vodka saying that only another addict would understand this.  She says that she identified very much with the video on breaking the  addiction cycle noting that like the woman in the video that she started going to different liquor stores so as to not call attention to how much she was drinking.    Progress Towards Goals: Heather Kaiser denies any alcohol use.    UDS collected: Yes Results: No   AA/NA attended?: Yes   Sponsor?: Yes   Adam Phenix, [place], [place], [facility], [place] 02/25/2022

## 2022-02-28 ENCOUNTER — Other Ambulatory Visit (HOSPITAL_COMMUNITY): Payer: 59 | Attending: Psychiatry | Admitting: Licensed Clinical Social Worker

## 2022-02-28 DIAGNOSIS — F1020 Alcohol dependence, uncomplicated: Secondary | ICD-10-CM | POA: Insufficient documentation

## 2022-02-28 DIAGNOSIS — R69 Illness, unspecified: Secondary | ICD-10-CM | POA: Diagnosis not present

## 2022-02-28 NOTE — Progress Notes (Signed)
Daily Group Progress Note   Program: CD IOP     Group Time: 9 a.m. to 12 p.m.   Type of Therapy: Process and Psychoeducational    Topic: The therapist checks in with group members, assesses for SI/HI/psychosis and overall level of functioning. The therapist inquires about sobriety date and number of community support meetings attended since last session.   The therapist shows a video on five tips for staying sober which include daily meeting attendance, talking to someone in recovery daily, praying and meditating daily, reading four pages of spiritual material, and doing a daily gratitude list. The therapist facilitates a discussion on the group's impression of these suggestions and whether they are doing any. He assigns the homework of group members doing the prayer/meditation, spiritual reading, and gratitude list daily for one week.  He discusses the concept of radical acceptance and how this applies to recovery.    Summary: [name] presents for group rating her depression as a "1" and anxiety as a "1"" with no SI or HI.    She describes her mood today as "proud" and "peaceful." She says that she is half way through her 4th Step noting that she cried but did not think of drinking.   In talking about the five tips for staying sober, [name] is attending daily meetings, talking to someone in recovery daily, and keeping a gratitude list that she text messages to two people per her Sponsor's recommendations.   She is not praying or meditating daily and doing spiritual readings daily except for the Just for Today readings done at meetings. [name] notes that she prefers to do her gratitude list first thing in the morning versus in the evening so as to counter any negative mood that she wakes up in.    Progress Towards Goals: [name] denies any alcohol use.    UDS collected: No Results: No   AA/NA attended?: Yes   Sponsor?: Yes   Adam Phenix, [place], [place], [facility], [place] 02/28/2022

## 2022-03-02 ENCOUNTER — Other Ambulatory Visit (HOSPITAL_COMMUNITY): Payer: 59 | Attending: Psychiatry | Admitting: Licensed Clinical Social Worker

## 2022-03-02 DIAGNOSIS — R69 Illness, unspecified: Secondary | ICD-10-CM | POA: Diagnosis not present

## 2022-03-02 DIAGNOSIS — F1020 Alcohol dependence, uncomplicated: Secondary | ICD-10-CM | POA: Diagnosis present

## 2022-03-02 NOTE — Progress Notes (Signed)
Daily Group Progress Note   Program: CD IOP     Group Time: 9 a.m. to 12 p.m.   Type of Therapy: Process and Psychoeducational    Topic: The therapist checks in with group members, assesses for SI/HI/psychosis and overall level of functioning. The therapist inquires about sobriety date and number of community support meetings attended since last session.    The therapist explains what is meant by catastrophizing and has group members complete and discuss a Core Beliefs Worksheet.   Summary: Heather Kaiser presents for group rating her depression as a "1" and anxiety as a "3"" with no SI or HI.    She describes her mood today as "excited" and "free" as she completed her 4th Step without drinking and is going to do her 5th Step with her Sponsor next Tuesday.   She also says that her sister and her "babies" are coming to visit this weekend. She lists a number of things for which she is grateful one of which is people noticing how much happier she looks. [name] was happy to see the other female in group at her AA meeting this week.  [name] shares her answers to the Core Beliefs Worksheet noting that she sees others as better than herself. The therapist encourages her to determine specifically how people are better than her and to discuss this irrational belief with her Sponsor.    Progress Towards Goals: Heather Kaiser denies any alcohol use.    UDS collected: No Results: No   AA/NA attended?: Yes   Sponsor?: Yes   Adam Phenix, [place], [place], [facility], [place] 03/02/2022

## 2022-03-05 ENCOUNTER — Other Ambulatory Visit (HOSPITAL_COMMUNITY): Payer: 59 | Attending: Psychiatry | Admitting: Licensed Clinical Social Worker

## 2022-03-05 DIAGNOSIS — F32A Depression, unspecified: Secondary | ICD-10-CM | POA: Insufficient documentation

## 2022-03-05 DIAGNOSIS — F419 Anxiety disorder, unspecified: Secondary | ICD-10-CM | POA: Diagnosis not present

## 2022-03-05 DIAGNOSIS — F1020 Alcohol dependence, uncomplicated: Secondary | ICD-10-CM | POA: Diagnosis not present

## 2022-03-05 DIAGNOSIS — R69 Illness, unspecified: Secondary | ICD-10-CM | POA: Diagnosis not present

## 2022-03-05 NOTE — Progress Notes (Signed)
Daily Group Progress Note   Program: CD IOP     Group Time: 9 a.m. to 12 p.m.   Type of Therapy: Process and Psychoeducational    Topic: The therapist checks in with group members, assesses for SI/HI/psychosis and overall level of functioning. The therapist inquires about sobriety date and number of community support meetings attended since last session.   As there is a new group member in attendance today, the therapist has group members share their stories of how they came to be in CD IOP.    Summary: Heather Kaiser presents for group rating her depression as a "1" and anxiety as a "1"" with no SI or HI.    She describes her mood today as "joyful" and "loved." She talks about having made the decision recently to change Sponsors having gone back to her 1st Sponsor. She says that she has concerns about her other Sponsor such that she did not feel comfortable in sharing her 5th Step with her. She feels very good about her 1st Sponsor and doing her 5th Step noting that they previously got through Step 8. The therapist praises Heather Kaiser for having been assertive in changing Sponsors and overcoming her fear of what people would think of her doing this. He reinforces that not every Sponsor is the best fit for every Sponsee.   [name] says that she picked up her 30 day chip yesterday. She shares her story with the group reiterating a familiar theme concerning her father having chosen alcohol over her.    Progress Towards Goals: Heather Kaiser denies any alcohol use.    UDS collected: Yes Results: No   AA/NA attended?: Yes   Sponsor?: Yes   Adam Phenix, [place], [place], [facility], [place] 03/05/2022

## 2022-03-07 ENCOUNTER — Other Ambulatory Visit (HOSPITAL_COMMUNITY): Payer: 59 | Attending: Psychiatry | Admitting: Licensed Clinical Social Worker

## 2022-03-07 DIAGNOSIS — F1020 Alcohol dependence, uncomplicated: Secondary | ICD-10-CM | POA: Diagnosis not present

## 2022-03-07 DIAGNOSIS — F32A Depression, unspecified: Secondary | ICD-10-CM | POA: Diagnosis present

## 2022-03-07 DIAGNOSIS — R69 Illness, unspecified: Secondary | ICD-10-CM | POA: Diagnosis not present

## 2022-03-07 DIAGNOSIS — F419 Anxiety disorder, unspecified: Secondary | ICD-10-CM | POA: Diagnosis not present

## 2022-03-07 NOTE — Progress Notes (Signed)
Daily Group Progress Note   Program: CD IOP     Group Time: 9 a.m. to 11:15 a.m.   Type of Therapy: Process and Psychoeducational    Topic: The therapist checks in with group members, assesses for SI/HI/psychosis and overall level of functioning. The therapist inquires about sobriety date and number of community support meetings attended since last session.   The therapist has group members complete the "Understanding Ambivalence To Change-Worksheet" and the "Building Discrepancy" Worksheet and discuss them with the group.    Summary: Heather Kaiser presents for group rating her depression as a "2" and anxiety as a "2"" with no SI or HI.    She describes her mood today as "hurt" and "disappointed" as she noticed on her half-sister's mother's Facebook that she has her half-sister's kids about 90% of the time.  Heather Kaiser says that her half-sister is doing exactly the same thing that their father did who chose alcohol over them; however, Heather Kaiser believes that her sister is choosing men but admits that her sister likely has some substance issues as well. She says that she wants badly to contact her sister's mother; however, she does not do so as when she has done this in the past, her half-sister's mother has urged [name] to help her sister.  Heather Kaiser says that she cannot help her sister still blaming her for their father's death. [name] shares her worksheets with the group and has to leave early today as she tried some [place] mushrooms and is apparently allergic such that they have caused her to feel nauseated and sick.    Progress Towards Goals: Heather Kaiser denies any alcohol use.    UDS collected: No Results: No   AA/NA attended?: Yes   Sponsor?: Yes   Adam Phenix, [place], [place], [facility], [place] 03/07/2022

## 2022-03-09 ENCOUNTER — Other Ambulatory Visit (HOSPITAL_COMMUNITY): Payer: 59 | Attending: Psychiatry | Admitting: Licensed Clinical Social Worker

## 2022-03-09 DIAGNOSIS — F1020 Alcohol dependence, uncomplicated: Secondary | ICD-10-CM | POA: Diagnosis not present

## 2022-03-09 DIAGNOSIS — F419 Anxiety disorder, unspecified: Secondary | ICD-10-CM | POA: Diagnosis not present

## 2022-03-09 DIAGNOSIS — R69 Illness, unspecified: Secondary | ICD-10-CM | POA: Diagnosis not present

## 2022-03-09 DIAGNOSIS — F32A Depression, unspecified: Secondary | ICD-10-CM | POA: Diagnosis present

## 2022-03-09 NOTE — Progress Notes (Signed)
Daily Group Progress Note   Program: CD IOP     Group Time: 9 a.m. to 11:15 a.m.   Type of Therapy: Process and Psychoeducational    Topic: The therapist checks in with group members, assesses for SI/HI/psychosis and overall level of functioning. The therapist inquires about sobriety date and number of community support meetings attended since last session.   The therapist covers Matrix Modules RP 15 on Managing Life Managing Money and RP 16 on Relapse Justification.    Summary: Heather Kaiser presents for group rating her depression as a "1" and anxiety as a "1"" with no SI or HI.    She describes her mood today as "exited" and "content" as she is going to have a sleep over with her best friend whom she has know since the 3rd grade this weekend. She also is looking forward to her birthday next week and having is sober saying that she was in a meeting throwing up last year from drinking.  She says that she has worked Steps 5, 6, and 7 with her Publishing copy. She asks to leave early today as she wants to make a meeting to see a speaker who her Sponsor strongly recommended that she see so the therapist is agreeable to this.    Progress Towards Goals: Heather Kaiser denies any alcohol use.    UDS collected: No Results: Yes; negative for alcohol   AA/NA attended?: Yes   Sponsor?: Yes   Adam Phenix, [place], [place], [employer], [place] 03/09/2022

## 2022-03-12 ENCOUNTER — Other Ambulatory Visit (HOSPITAL_COMMUNITY): Payer: 59 | Attending: Psychiatry | Admitting: Licensed Clinical Social Worker

## 2022-03-12 DIAGNOSIS — R69 Illness, unspecified: Secondary | ICD-10-CM | POA: Diagnosis not present

## 2022-03-12 DIAGNOSIS — F909 Attention-deficit hyperactivity disorder, unspecified type: Secondary | ICD-10-CM | POA: Insufficient documentation

## 2022-03-12 DIAGNOSIS — F1020 Alcohol dependence, uncomplicated: Secondary | ICD-10-CM

## 2022-03-12 DIAGNOSIS — F331 Major depressive disorder, recurrent, moderate: Secondary | ICD-10-CM | POA: Diagnosis not present

## 2022-03-12 NOTE — Progress Notes (Signed)
Daily Group Progress Note   Program: CD IOP     Group Time: 9 a.m. to 12 p.m.   Type of Therapy: Process and Psychoeducational    Topic: The therapist checks in with group members, assesses for SI/HI/psychosis and overall level of functioning. The therapist inquires about sobriety date and number of community support meetings attended since last session.   The therapist shows videos on "10 Effects of Growing Up with an Alcoholic or Addict Parent" and "Why Do People with Addictions Choose Drugs/Alcohol Over Their Families" and facilitates a discussion concerning the material presented.     Summary: Heather Kaiser presents for group rating her depression as a "1" and anxiety as a "1"" with no SI or HI.    She describes her mood today as "thankful" and "optimistic" saying that the speaker meeting she attended last Friday was "awesome."   Heather Kaiser enjoyed her sleep over with her friend and looks forward to hopefully having her first sober birthday in 20 years this coming Wednesday.   Heather Kaiser says that everything in today's videos resonate with her. The therapist explains how Heather Kaiser's father did not choose alcohol over her and how her belief that he did can lead to a negative self-image of worthlessness which in turn can make her feel chronically depressed thus causing emotional relapse eventually leading to drinking.  She says that she is getting a different perspective on this and the therapist encourages her to draw on her experience as a recovering person herself and her own history to see this situation from a different light.    Progress Towards Goals: Heather Kaiser denies any alcohol use.    UDS collected: Yes Results: No   AA/NA attended?: Yes   Sponsor?: Yes   Adam Phenix, [place], [place], [facility], [place] 03/12/2022

## 2022-03-14 ENCOUNTER — Other Ambulatory Visit (HOSPITAL_COMMUNITY): Payer: 59 | Attending: Psychiatry | Admitting: Licensed Clinical Social Worker

## 2022-03-14 DIAGNOSIS — F1020 Alcohol dependence, uncomplicated: Secondary | ICD-10-CM | POA: Diagnosis present

## 2022-03-14 DIAGNOSIS — R69 Illness, unspecified: Secondary | ICD-10-CM | POA: Diagnosis not present

## 2022-03-14 NOTE — Progress Notes (Signed)
Daily Group Progress Note   Program: CD IOP     Group Time: 9 a.m. to 12 p.m.   Type of Therapy: Process and Psychoeducational    Topic: The therapist checks in with group members, assesses for SI/HI/psychosis and overall level of functioning. The therapist inquires about sobriety date and number of community support meetings attended since last session.   The therapist shares the Just for Today reading from NA on the concept of playing principles before personalities and discusses what is meant by this and why it is important in regard to a person's being able to stay connected to a Twelve Step Program.  The therapist covers Matrix module RP 17 on Taking Care of Yourself and module RP 18 on Emotional Triggers.     Summary: [name] presents for group rating her depression as a "1" and anxiety as a "1"" with no SI or HI.    She describes her mood today as "amazed" and "thankful." Today is her 31th birthday, and she is happy to be sober noting that last year she was at an [place] meeting having to throw up twice due to being so hungover.  In regard to taking care of herself, she admits that she has not seen a dentist in years. When the therapist questions what [name] is going to do about this, she says that she can start looking for dental insurance immediately and then schedule with a dentist though admitting some apparent fear of dentists.  [name] discusses how loneliness and sadness were big emotional triggers for her to drink. She admits that she always associated guys i. e. dating as associated with drinking and admits to being fearful about having to date sober with other group members admitting to a fear of dating and/over having sex sober.  The therapist normalizes these fears while at the same time pointing out that they can get through them and be able to sit with this  anxiety without having to drink or use substances.    Progress Towards Goals: [name] denies any alcohol use.    UDS  collected: No Results: No   AA/NA attended?: Yes   Sponsor?: Yes   Adam Phenix, [place], LCSW, [facility], [place] 03/14/2022

## 2022-03-16 ENCOUNTER — Other Ambulatory Visit (HOSPITAL_COMMUNITY): Payer: 59 | Admitting: Licensed Clinical Social Worker

## 2022-03-16 DIAGNOSIS — F1020 Alcohol dependence, uncomplicated: Secondary | ICD-10-CM

## 2022-03-16 DIAGNOSIS — F4312 Post-traumatic stress disorder, chronic: Secondary | ICD-10-CM | POA: Diagnosis not present

## 2022-03-16 DIAGNOSIS — K2921 Alcoholic gastritis with bleeding: Secondary | ICD-10-CM | POA: Diagnosis not present

## 2022-03-16 DIAGNOSIS — F900 Attention-deficit hyperactivity disorder, predominantly inattentive type: Secondary | ICD-10-CM | POA: Diagnosis not present

## 2022-03-16 DIAGNOSIS — R69 Illness, unspecified: Secondary | ICD-10-CM | POA: Diagnosis not present

## 2022-03-16 DIAGNOSIS — Z6372 Alcoholism and drug addiction in family: Secondary | ICD-10-CM | POA: Diagnosis not present

## 2022-03-16 DIAGNOSIS — F419 Anxiety disorder, unspecified: Secondary | ICD-10-CM | POA: Diagnosis not present

## 2022-03-16 DIAGNOSIS — F502 Bulimia nervosa: Secondary | ICD-10-CM | POA: Diagnosis not present

## 2022-03-16 DIAGNOSIS — F32A Depression, unspecified: Secondary | ICD-10-CM | POA: Diagnosis not present

## 2022-03-16 DIAGNOSIS — K2101 Gastro-esophageal reflux disease with esophagitis, with bleeding: Secondary | ICD-10-CM | POA: Diagnosis not present

## 2022-03-16 NOTE — Progress Notes (Signed)
Daily Group Progress Note   Program: CD IOP     Group Time: 9 a.m. to 11:10 a.m.   Type of Therapy: Process and Psychoeducational    Topic: The therapist checks in with group members, assesses for SI/HI/psychosis and overall level of functioning. The therapist inquires about sobriety date and number of community support meetings attended since last session.    The therapist introduces concepts from Acceptance and Commitment therapy and has group members begin working on and discussing ACT handouts on clarifying their values, Dissecting the Problem, The Life Compass, etcetera.   The therapist discusses how self-compassion involves identifying and changing critical self-talk.     Summary: Heather Kaiser presents for group rating her depression as a "1" and anxiety as a "1"" with no SI or HI.    She describes her mood today as "awe" and "thankful" sharing about how people at one of her [place] meeting surprised her by singing Happy Rudene Anda and giving her a card.  Heather Kaiser admits to being enthralled by another group member's story about his ex saying that this is one of the reasons that she loves speaker meetings so much.  When the therapist shares a quote about self-hate and a lack of self-care being at the core of addiction, Heather Kaiser admits that she has felt self-hatred in the past and that it was a driving force for her to drink.   She asks permission to leave group early today to go to the [place] with some friends having a free pass noting that she is excited to be going.  She takes the ACT worksheets with her to complete.    Progress Towards Goals: Heather Kaiser denies any alcohol use.    UDS collected: No Results: Yes, negative for drugs or alcohol   AA/NA attended?: Yes   Sponsor?: Yes   Heather Kaiser, [place], [place], [facility], [place] 03/16/2022

## 2022-03-19 ENCOUNTER — Other Ambulatory Visit (HOSPITAL_COMMUNITY): Payer: 59 | Attending: Psychiatry | Admitting: Licensed Clinical Social Worker

## 2022-03-19 DIAGNOSIS — F419 Anxiety disorder, unspecified: Secondary | ICD-10-CM | POA: Insufficient documentation

## 2022-03-19 DIAGNOSIS — R69 Illness, unspecified: Secondary | ICD-10-CM | POA: Diagnosis not present

## 2022-03-19 DIAGNOSIS — F32A Depression, unspecified: Secondary | ICD-10-CM | POA: Diagnosis present

## 2022-03-19 DIAGNOSIS — F1020 Alcohol dependence, uncomplicated: Secondary | ICD-10-CM

## 2022-03-19 NOTE — Progress Notes (Signed)
Daily Group Progress Note   Program: CD IOP     Group Time: 9 a.m. to 12 p.m.   Type of Therapy: Process and Psychoeducational    Topic: The therapist checks in with group members, assesses for SI/HI/psychosis and overall level of functioning. The therapist inquires about sobriety date and number of community support meetings attended since last session.   The therapist has group members share what they recorded on their  ACT handouts; Dissecting the Problem, The Life Compass, etcetera and provides feedback.     Summary: Heather Kaiser presents for group rating her depression as a "1" and anxiety as a "1"" with no SI or HI.    She describes her mood today as "inspired" and "thankful." She says that she continues to work on Steps 6 and 7 daily and on three specific character defects.  Heather Kaiser is unable to share her answers to the ACT handouts as the group runs out of time such that she will share hers on 03/21/22.  The therapist asks Heather Kaiser what she is going to do regarding work. [name] says that she could go back to where she was working previously; however, she is reluctant to do so saying that she has too many bad memories. She says that the Supervisor who took over ran things like a dictatorship. She also admits that she was romantically involved with a co-worker who turned out to be a narcissist. She admits that she has tended to get into romantic relationships via work having previously has few other social outlets.  The therapist emails Heather Kaiser links to some free, on-line career tests so she can consider what it is that she is going to do.    Progress Towards Goals: Heather Kaiser denies any alcohol use.    UDS collected: Yes Results: No   AA/NA attended?: Yes   Sponsor?: Yes   Adam Phenix, [place], [place], [facility], [place] 03/19/2022

## 2022-03-21 ENCOUNTER — Other Ambulatory Visit (HOSPITAL_COMMUNITY): Payer: 59 | Attending: Psychiatry | Admitting: Licensed Clinical Social Worker

## 2022-03-21 DIAGNOSIS — E860 Dehydration: Secondary | ICD-10-CM | POA: Insufficient documentation

## 2022-03-21 DIAGNOSIS — F10939 Alcohol use, unspecified with withdrawal, unspecified: Secondary | ICD-10-CM | POA: Insufficient documentation

## 2022-03-21 DIAGNOSIS — E8729 Other acidosis: Secondary | ICD-10-CM | POA: Insufficient documentation

## 2022-03-21 DIAGNOSIS — N179 Acute kidney failure, unspecified: Secondary | ICD-10-CM | POA: Insufficient documentation

## 2022-03-21 DIAGNOSIS — F1020 Alcohol dependence, uncomplicated: Secondary | ICD-10-CM

## 2022-03-21 NOTE — Progress Notes (Signed)
Daily Group Progress Note   Program: CD IOP     Group Time: 9 a.m. to 12 p.m.   Type of Therapy: Process and Psychoeducational    Topic: The therapist checks in with group members, assesses for SI/HI/psychosis and overall level of functioning. The therapist inquires about sobriety date and number of community support meetings attended since last session.   The therapist has one group member share what she recorded on their  ACT handouts; Dissecting the Problem, The Life Compass, etcetera and provides feedback.   The therapist facilitates group discussion on feeling one's feelings versus number and on what constitutes a health work relationship and what constitutes a healthy relationship with a significant other and/or partner. The therapist notes that if one or both of these areas are not good that this can cause significant stress that could be a trigger for relapse.    Summary: Heather Kaiser presents for group rating her depression as a "1" and anxiety as a "1"" with no SI or HI.    She describes her mood today as "content" and "joyful." She says that she now has about 48 days of sobriety saying that she attended 2 meetings yesterday.  In discussing how group members used to use drugs and/or alcohol to emotionally number themselves, Heather Kaiser says that the "good thing" about recovery is that a person gets his or her feeling back; however, the "bad thing" about recovery is that the person gets his or her feelings back.   Heather Kaiser shares her answers to the ACT handouts with the main theme being that she never thinks she is enough. She also suggests that she preemptively will come up with something to be upset about a boyfriend about. When pressed for examples from past relationships, the majority of the things about which Heather Kaiser was upset were not unreasonable with the only example being Heather Kaiser's being upset with a friend with benefits not wanting a committed relationship when they both agree up front that  the relationship was not to be romantic.  After she comes back from break before the other group member returns, she discloses that she has a crush on one of the guys in [facility] who is not in attendance today. She is apparently attracted to him due to his focus on his daughter as she perceived that her father chose everything else over her. In discussing this, she realizes that she was attracted to one of her ex's for the same reason; however, he spent all his time taking care of his child and his alcoholic father such that he had no time for Heather Kaiser. Thus, the therapist points out that this one trait does not suggest that Heather Kaiser would be compatible with this person or that the person does not have other areas of his life that he neglects. The therapist also talks about AA's suggestion that people avoid romantic relationships until they have at least a year of sobriety.  Heather Kaiser takes the free, on-line [place] career test clearing scoring highest in the mechanical area such that a hands-on job like the one she recently had would be a great fit. She questions if she should return to her previous job assuming they may want her back as her boss and the HR person have liked things on her Facebook. The therapist suggest that Heather Kaiser would need to assert herself in confirming if 1) she is eligible for rehire and 2) if they have an opening.   After discussing this issue at great length, Heather Kaiser realizes  that she loved what she did but had problems with her immediate Supervisor who is still there though most of the line employees with whom she had problems are not. She agrees that it is not likely that this Supervisor has changed completely in only 6 months since she last worked there so is encouraged to look at different jobs that are hands on like this one.    Progress Towards Goals: Heather Kaiser denies any alcohol use.    UDS collected: No Results: No   AA/NA attended?: Yes   Sponsor?: Yes   Adam Phenix, [place], [place],  [facility], [place] 03/21/2022

## 2022-03-23 ENCOUNTER — Other Ambulatory Visit (HOSPITAL_COMMUNITY): Payer: 59 | Attending: Psychiatry | Admitting: Licensed Clinical Social Worker

## 2022-03-23 DIAGNOSIS — R45851 Suicidal ideations: Secondary | ICD-10-CM | POA: Insufficient documentation

## 2022-03-23 DIAGNOSIS — R69 Illness, unspecified: Secondary | ICD-10-CM | POA: Diagnosis not present

## 2022-03-23 DIAGNOSIS — F1020 Alcohol dependence, uncomplicated: Secondary | ICD-10-CM

## 2022-03-23 NOTE — Progress Notes (Signed)
Daily Group Progress Note   Program: CD IOP     Group Time: 9 a.m. to 12 p.m.   Type of Therapy: Process and Psychoeducational    Topic: The therapist checks in with group members, assesses for SI/HI/psychosis and overall level of functioning. The therapist inquires about sobriety date and number of community support meetings attended since last session.   The therapist reads the NA Just for Today reading discussing what is meant by living "life on life's terms" and emphasizing that life will still have struggles when one is sober but that a person can get through these struggles without using with support and using new skills.  The therapist shows two videos; one on how to use Acceptance and Commitment therapy tools to avoid the "happiness trap" and another video with Dr. Rachel Moulds illustrating how and why addiction is a disease versus a character defect.   Summary: Heather Kaiser presents for group rating her depression as a "1" and anxiety as a "1"" with no SI or HI.    She describes her mood today as "grateful" and "excited." She says that she recently attended another AA member's celebration of four years of sobriety and she is excited that her sister and her children are coming into town this weekend.  Heather Kaiser is attentive during today's videos and actively takes notes. She explains how she has already been implementing some of the skills illustrated in the video and says that her biggest takeaway from the video on addiction as a disease is that one can choose but that one cannot choose to not crave.    Progress Towards Goals: Heather Kaiser denies any alcohol use.    UDS collected: No Results: Yes; negative for alcohol   AA/NA attended?: Yes   Sponsor?: Yes   Adam Phenix, [place], [address], [facility], [place] 03/23/2022

## 2022-03-26 ENCOUNTER — Telehealth (HOSPITAL_COMMUNITY): Payer: Self-pay | Admitting: Licensed Clinical Social Worker

## 2022-03-26 ENCOUNTER — Encounter (HOSPITAL_COMMUNITY): Payer: 59

## 2022-03-26 NOTE — Telephone Encounter (Signed)
The therapist receives the following email from [place] on 03/23/22:  "Hey,  I as you say it ; 'have a prior engagement' Monday morning at 11:00am. I will not be able to make it that morning but will be there Wednesday.   Thanks, [id number], [place], [place], [facility], [facility] 03/26/2022

## 2022-03-28 ENCOUNTER — Encounter (HOSPITAL_COMMUNITY): Payer: Self-pay | Admitting: Licensed Clinical Social Worker

## 2022-03-28 ENCOUNTER — Other Ambulatory Visit (HOSPITAL_COMMUNITY): Payer: 59 | Attending: Psychiatry | Admitting: Licensed Clinical Social Worker

## 2022-03-28 DIAGNOSIS — K2921 Alcoholic gastritis with bleeding: Secondary | ICD-10-CM | POA: Insufficient documentation

## 2022-03-28 DIAGNOSIS — N179 Acute kidney failure, unspecified: Secondary | ICD-10-CM | POA: Diagnosis not present

## 2022-03-28 DIAGNOSIS — F10280 Alcohol dependence with alcohol-induced anxiety disorder: Secondary | ICD-10-CM | POA: Insufficient documentation

## 2022-03-28 DIAGNOSIS — F1020 Alcohol dependence, uncomplicated: Secondary | ICD-10-CM | POA: Insufficient documentation

## 2022-03-28 DIAGNOSIS — E8729 Other acidosis: Secondary | ICD-10-CM | POA: Diagnosis not present

## 2022-03-28 DIAGNOSIS — F39 Unspecified mood [affective] disorder: Secondary | ICD-10-CM | POA: Diagnosis not present

## 2022-03-28 DIAGNOSIS — Z8659 Personal history of other mental and behavioral disorders: Secondary | ICD-10-CM | POA: Insufficient documentation

## 2022-03-28 DIAGNOSIS — F509 Eating disorder, unspecified: Secondary | ICD-10-CM | POA: Diagnosis not present

## 2022-03-28 DIAGNOSIS — K219 Gastro-esophageal reflux disease without esophagitis: Secondary | ICD-10-CM | POA: Diagnosis not present

## 2022-03-28 DIAGNOSIS — X838XXD Intentional self-harm by other specified means, subsequent encounter: Secondary | ICD-10-CM | POA: Diagnosis not present

## 2022-03-28 DIAGNOSIS — F502 Bulimia nervosa: Secondary | ICD-10-CM | POA: Diagnosis not present

## 2022-03-28 DIAGNOSIS — K2101 Gastro-esophageal reflux disease with esophagitis, with bleeding: Secondary | ICD-10-CM

## 2022-03-28 DIAGNOSIS — Z6372 Alcoholism and drug addiction in family: Secondary | ICD-10-CM | POA: Insufficient documentation

## 2022-03-28 DIAGNOSIS — F10939 Alcohol use, unspecified with withdrawal, unspecified: Secondary | ICD-10-CM | POA: Insufficient documentation

## 2022-03-28 DIAGNOSIS — F4312 Post-traumatic stress disorder, chronic: Secondary | ICD-10-CM | POA: Diagnosis not present

## 2022-03-28 DIAGNOSIS — F32A Depression, unspecified: Secondary | ICD-10-CM

## 2022-03-28 DIAGNOSIS — E860 Dehydration: Secondary | ICD-10-CM | POA: Diagnosis not present

## 2022-03-28 DIAGNOSIS — F1021 Alcohol dependence, in remission: Secondary | ICD-10-CM

## 2022-03-28 DIAGNOSIS — R69 Illness, unspecified: Secondary | ICD-10-CM | POA: Diagnosis not present

## 2022-03-28 DIAGNOSIS — F419 Anxiety disorder, unspecified: Secondary | ICD-10-CM

## 2022-03-28 NOTE — Progress Notes (Addendum)
[facility] IOP                                        Discharge Summary   Date of Admission: 02/12/2022 Referall Source: Dr Ursula Alert, MD                                                                      Date of Discharge: 03/30/2022 Sobriety Date:01/31/2022   Admission Diagnosis: . Alcohol use disorder, severe, dependence ([place])  F10.20       2. Alcoholic gastritis with hemorrhage, unspecified chronicity  K29.21      Diagnosed as GERD in past     3. 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 Chronic post-traumatic stress disorder (PTSD)  F43.12       5. Mood disorder ([place])  F39       6. Eating disorder, unspecified type  F50.9       7. Bulimia nervosa  F50.2       8. Suicide gesture, subsequent encounter ([place])  [id number]       9. History of ADHD  Z86.59       10. Alcoholism and drug addiction in family  Z30.72      Father   Course of Treatment:  Patient was admitted after receiving a referral from Dr. Shea Evans. She was initially seen in August and had decided to go to residential treatment but on 02/02/2022 she presented to [facility] with hematemesis was probably from Mallory-Weiss tear versus esophagitis secondary to GERD.  She was also found to have alcoholic ketoacidosis, acute kidney injury, hypercalcemia, hypokalemia, hypomagnesemia and severe hypophosphatemia.   She was treated with IV fluids, IV Protonix and electrolytes were repleted orally and intravenously.  She was also given IV Ativan for alcohol withdrawal syndrome. She was discharged on 02/04/2022 and on 9/19 contacted CD IOP Counselor.and began IOP 9/22 having met criteria for admission per ASAM's:  Six Dimensions of Multidimensional Assessment    Since admission she has been  very focused on her need to stop drinking alcohol if she wants to go on living. As a result of her encounter with hematemesis ,she is convinced she  will die if she continues to drink. She had no cravings nor desire to drink .She did not experience any further withdrawal after her hospitalization.  She had never been to treatment but has attended AA and has a Publishing copy with whom she recently got back in touch She continued to use AA as her support during the course of her treatment. She was faithful in attendance and active in group.All her UDS were clear.  Medications: Your Medication List PriLOSEC OTC 20 MG tablet Generic drug: omeprazole Take 1 tablet (20 mg total) by mouth daily.   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 Continue alcohol and drug refusal skills and call on support system  Continue AA meetings AT LEAST as often as you use  Continue with a sponsor and a home group in [place] Return to PCP/Counselor as scheduled  Referrals:  Aftercare:Here with [facility] IOP Counselor Garret Medication management:PCP Other:NA  Next appointment: with Counselor (Virtual)  Prognosis:Good as long as she continues her recovery program  Client has participated in the development of this discharge plan and has received a copy of this completed plan  Heather Russian, PA-CPatient ID: Heather Kaiser, female   DOB: 11/16/1990, 31 y.o.   MRN: [id number]

## 2022-03-28 NOTE — Addendum Note (Signed)
Addended by: Dara Hoyer on: 03/28/2022 03:06 PM   Modules accepted: Level of Service

## 2022-03-28 NOTE — Progress Notes (Signed)
Daily Group Progress Note   Program: CD IOP     Group Time: 9 a.m. to 12 p.m.   Type of Therapy: Process and Psychoeducational    Topic: The therapist checks in with group members, assesses for SI/HI/psychosis and overall level of functioning. The therapist inquires about sobriety date and number of community support meetings attended since last session.   As there is a new member in group today, the therapist expands the normal group check-in having members share their histories of substance use and how they came to be in [facility] IOP.  The therapist explains to group members why certain substances require inpatient detox while others do not and facilitates a discussion on the fact that because a substance is legal does not mean that it is not addictive or benign.  The therapist presents Matrix Module RP 19 on Illness.   Summary: Heather Kaiser presents for group rating her depression as a "1" and anxiety as a "1"" with no SI or HI.    She describes her mood today as "blessed" and "optimistic." She contacted her old boss and will be returning to work on Monday, 04/02/22, with Friday, 03/30/22 being her last day in [facility]. She asks the therapist if she is graduating or leaving AMA with the therapist informing her that she is graduating as the only thing that needed to be addressed was her vocational plan which is resolved with the return to her old job.   The therapist notes that Heather Kaiser is currently on the "pink cloud" but predicts that somewhere in her first year of recovery that she will feel like she has fallen off this cloud. The therapist suggests that if this does occur that it is normal and that the important thing to do is to keep attending meetings and to talk about it.  When [employer] about trusting herself in certain situations where people are drinking while noting how others in AA avoid any and all contact with alcohol, the therapist reminds her of the importance of being smart and not  strong especially within the first 12 months of recovery.   She has attended five meetings in five days and has worked through Step 7. In discussing illness, [name] notes that she hates being injured as she can't do what she wants to do with the therapist noting how being angry about this can lead to drinking. He encourages her at these times to focus more on what she still can do.    Progress Towards Goals: Heather Kaiser denies any alcohol use.    UDS collected: Yes Results: No   AA/NA attended?: Yes   Sponsor?: Yes   Adam Phenix, [place], [place], [facility], [place] 03/28/2022

## 2022-03-30 ENCOUNTER — Other Ambulatory Visit (HOSPITAL_COMMUNITY): Payer: 59 | Attending: Psychiatry | Admitting: Licensed Clinical Social Worker

## 2022-03-30 DIAGNOSIS — F1010 Alcohol abuse, uncomplicated: Secondary | ICD-10-CM | POA: Diagnosis present

## 2022-03-30 DIAGNOSIS — F1021 Alcohol dependence, in remission: Secondary | ICD-10-CM

## 2022-03-30 DIAGNOSIS — R69 Illness, unspecified: Secondary | ICD-10-CM | POA: Diagnosis not present

## 2022-03-30 NOTE — Progress Notes (Signed)
Daily Group Progress Note   Program: [facility]     Group Time: 9 a.m. to 11:20 a.m.   Type of Therapy: Process and Psychoeducational    Topic: The therapist checks in with group members, assesses for SI/HI/psychosis and overall level of functioning. The therapist inquires about sobriety date and number of community support meetings attended since last session.   The therapist facilitates a discussion on a variety of subjects including: the "13th Step;" how using any addictive substance, including tobacco, can triggers a relapse for other addictive substances; how to choose a Sponsor and ask a person to be a Sponsor,  what is meant by having a "reservation" in recovery, etcetera.  The therapist shows two Anthonette Legato videos that illustrate that there is no such thing as a "soft" versus a "hard" drug.  Summary: [name] presents for group rating her depression as a "1" and anxiety as a "1"" with no SI or HI.    She describes her mood today as "thankful" and "optimistic." As today is her last day in [facility], she summarizes the things that she learned in group noting that the experience was helpful to her and what she needed.   [name] is active in the discussion concerning how to choose a Sponsor and recommends to another member that it is important to choose someone who is "enthusiastic" about his recovery and engaged in the meetings and not distracted and on his phone.   [name] gets a text message during group and informs the therapist that her stepfather invited her to go to lunch with him and her big boss so needs to leave group early in order to make it on time. She notes that this boss and several other people in management have alcohol in their desks and drink during the day and likely have substance use issues; however, she says that this boss will not drink around her at lunch.   She wants to schedule therapy with this therapist via tele-video; however, is unable to schedule a session before  leaving as she does not know her work schedule as of yet. The therapist provides her with his direct callback number and email with [name] noting that she will get in touch with him to schedule upon having her work schedule.   The therapist informs [name] that if she keeps doing all the things she has been doing since starting [facility] that her long-term prognosis is very good.    Progress Towards Goals: [name] denies any alcohol use.    UDS collected: No Results: No   AA/NA attended?: Yes   Sponsor?: Yes   Adam Phenix, [place], LCSW, [facility], [place] 03/30/2022

## 2022-04-02 ENCOUNTER — Encounter (HOSPITAL_COMMUNITY): Payer: 59

## 2022-04-17 ENCOUNTER — Encounter: Payer: Self-pay | Admitting: Obstetrics and Gynecology

## 2022-04-18 ENCOUNTER — Other Ambulatory Visit: Payer: Self-pay | Admitting: Obstetrics and Gynecology

## 2022-04-18 MED ORDER — METRONIDAZOLE 0.75 % VA GEL: 1.0000 | Freq: Every day | VAGINAL | 0 refills | Status: AC

## 2022-04-18 NOTE — Progress Notes (Signed)
Rx RF metrogel for BV sx.

## 2022-05-09 DIAGNOSIS — J0100 Acute maxillary sinusitis, unspecified: Secondary | ICD-10-CM | POA: Diagnosis not present

## 2022-05-25 ENCOUNTER — Ambulatory Visit: Payer: 59 | Admitting: Plastic Surgery

## 2022-05-25 ENCOUNTER — Encounter: Payer: Self-pay | Admitting: Plastic Surgery

## 2022-05-25 VITALS — BP 123/82 | HR 101 | Ht <= 58 in | Wt 165.0 lb

## 2022-05-25 DIAGNOSIS — Z6822 Body mass index (BMI) 22.0-22.9, adult: Secondary | ICD-10-CM | POA: Diagnosis not present

## 2022-05-25 DIAGNOSIS — N62 Hypertrophy of breast: Secondary | ICD-10-CM

## 2022-05-25 DIAGNOSIS — F32A Depression, unspecified: Secondary | ICD-10-CM

## 2022-05-25 DIAGNOSIS — M545 Low back pain, unspecified: Secondary | ICD-10-CM | POA: Diagnosis not present

## 2022-05-25 DIAGNOSIS — R69 Illness, unspecified: Secondary | ICD-10-CM | POA: Diagnosis not present

## 2022-05-25 DIAGNOSIS — M546 Pain in thoracic spine: Secondary | ICD-10-CM | POA: Diagnosis not present

## 2022-05-25 DIAGNOSIS — F17290 Nicotine dependence, other tobacco product, uncomplicated: Secondary | ICD-10-CM

## 2022-05-25 DIAGNOSIS — G8929 Other chronic pain: Secondary | ICD-10-CM

## 2022-05-25 DIAGNOSIS — M542 Cervicalgia: Secondary | ICD-10-CM | POA: Diagnosis not present

## 2022-05-25 DIAGNOSIS — M549 Dorsalgia, unspecified: Secondary | ICD-10-CM | POA: Insufficient documentation

## 2022-05-25 NOTE — Progress Notes (Signed)
Patient ID: Heather Kaiser, female    DOB: [birth year], 32 y.o.   MRN: [id number]   Chief Complaint  Patient presents with   Consult   Breast Problem    Mammary Hyperplasia: The patient is a 32 y.o. female with a history of mammary hyperplasia for several years.  She has extremely large breasts causing symptoms that include the following: Back pain in the upper and lower back, including neck pain. She pulls or pins her bra straps to provide better lift and relief of the pressure and pain. She notices relief by holding her breast up manually.  Her shoulder straps cause grooves and pain and pressure that requires padding for relief. Pain medication is sometimes required with motrin and tylenol.  Activities that are hindered by enlarged breasts include: exercise and running.  She has tried supportive clothing as well as fitted bras without improvement.  Her breasts are extremely large and fairly symmetric.  She has hyperpigmentation of the inframammary area on both sides.  The sternal to nipple distance on the right is 33 cm and the left is 33 cm.  The IMF distance is 22 cm.  She is 6 feet tall and weighs 165 pounds.  The BMI = 22.4 kg/m.  Preoperative bra size = DDD/F cup.  Would like to be much smaller.  We talked about a B or C cup.  The estimated excess breast tissue to be removed at the time of surgery = 550 grams on the left and 550 grams on the right.  Mammogram history: none.    Family history of breast cancer:  none.  Tobacco use: Vaping at present.   The patient expresses the desire to pursue surgical intervention.  She is a recovering alcoholic and has been doing really well in the last couple of years.  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  She could do light duty.  She would probably need to be off for 2 weeks and then light duty for 2 weeks when she gets back to work.  She will need to do physical therapy and is willing.  She  saw a orthopedic surgeon in [employer] and she is going to try and remember the names we can get the records.  She was told she has scoliosis.  She also has a new diagnosis of Raynaud's disease.  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     Review of Systems  Constitutional:  Positive for activity change. Negative for appetite change.  HENT: Negative.    Eyes: Negative.   Respiratory: Negative.  Negative for chest tightness and shortness of breath.   Cardiovascular: Negative.  Negative for leg swelling.  Gastrointestinal: Negative.   Endocrine: Negative.   Genitourinary: Negative.   Musculoskeletal: Negative.     Past Medical History:  Diagnosis Date   Anemia 2019   Anxiety    Fibroadenoma of breast, left    Fibroadenoma of breast, right 2020   per u/s   Scoliosis     Past Surgical History:  Procedure Laterality Date   BREAST BIOPSY        Current Outpatient Medications:    omeprazole (PRILOSEC OTC) 20 MG tablet, Take 1 tablet (20 mg total) by mouth daily., Disp: , Rfl:    Objective:   Vitals:  05/25/22 0914  BP: 123/82  Pulse: (!) 101  SpO2: 98%    Physical Exam Vitals reviewed.  Constitutional:      Appearance: Normal appearance.  HENT:     Head: Normocephalic and atraumatic.  Cardiovascular:     Rate and Rhythm: Normal rate.     Pulses: Normal pulses.  Pulmonary:     Effort: Pulmonary effort is normal.  Abdominal:     General: There is no distension.     Palpations: Abdomen is soft.  Musculoskeletal:        General: No swelling or deformity.  Skin:    General: Skin is warm.     Capillary Refill: Capillary refill takes less than 2 seconds.     Coloration: Skin is not jaundiced.     Findings: No bruising.  Neurological:     Mental Status: She is alert and oriented to person, place, and time.  Psychiatric:        Mood and Affect: Mood normal.         Behavior: Behavior normal.        Thought Content: Thought content normal.     Assessment & Plan:  Chronic bilateral thoracic back pain  Depression, unspecified depression type  Symptomatic mammary hypertrophy  The procedure the patient selected and that was best for the patient was discussed. The risk were discussed and include but not limited to the following:  Breast asymmetry, fluid accumulation, firmness of the breast, inability to breast feed, loss of nipple or areola, skin loss, change in skin and nipple sensation, fat necrosis of the breast tissue, bleeding, infection and healing delay.  There are risks of anesthesia and injury to nerves or blood vessels.  Allergic reaction to tape, suture and skin glue are possible.  There will be swelling.  Any of these can lead to the need for revisional surgery.  A breast reduction has potential to interfere with diagnostic procedures in the future.  This procedure is best done when the breast is fully developed.  Changes in the breast will continue to occur over time: pregnancy, weight gain or weigh loss.    Total time: 40 minutes. This includes time spent with the patient during the visit as well as time spent before and after the visit reviewing the chart, documenting the encounter, ordering pertinent studies and literature for the patient.   Physical therapy:  ordered Mammogram:  ordered  The patient is a good candidate for bilateral breast reduction with possible liposuction.  She would like to avoid all narcotics.  We could do a long-acting local and then Toradol for 3 days postop.  The patient will start physical therapy and then come back and see [place] for further evaluation in 7 weeks.  Pictures were obtained of the patient and placed in the chart with the patient's or guardian's permission.   [place], DO

## 2022-05-28 ENCOUNTER — Telehealth: Payer: Self-pay

## 2022-05-28 NOTE — Telephone Encounter (Signed)
Pt will be calling in to give [place] the name of the orthopaedic spine doctor she saw in [place]. Once I receive that information, I will fax the release of information form.

## 2022-06-05 DIAGNOSIS — R634 Abnormal weight loss: Secondary | ICD-10-CM | POA: Diagnosis not present

## 2022-06-05 DIAGNOSIS — J029 Acute pharyngitis, unspecified: Secondary | ICD-10-CM | POA: Diagnosis not present

## 2022-06-27 ENCOUNTER — Ambulatory Visit: Payer: 59 | Admitting: Physical Therapy

## 2022-07-02 ENCOUNTER — Ambulatory Visit: Payer: 59 | Admitting: Physical Therapy

## 2022-07-04 ENCOUNTER — Ambulatory Visit: Payer: 59 | Admitting: Physical Therapy

## 2022-07-09 ENCOUNTER — Encounter: Payer: 59 | Admitting: Physical Therapy

## 2022-07-10 ENCOUNTER — Encounter: Payer: 59 | Admitting: Physical Therapy

## 2022-07-11 ENCOUNTER — Ambulatory Visit: Payer: 59 | Attending: Plastic Surgery | Admitting: Physical Therapy

## 2022-07-13 ENCOUNTER — Ambulatory Visit: Payer: 59 | Admitting: Surgical

## 2022-07-16 ENCOUNTER — Ambulatory Visit: Payer: 59 | Admitting: Physical Therapy

## 2022-07-17 ENCOUNTER — Encounter: Payer: 59 | Admitting: Physical Therapy

## 2022-07-18 ENCOUNTER — Encounter: Payer: 59 | Admitting: Physical Therapy

## 2022-07-25 ENCOUNTER — Encounter: Payer: 59 | Admitting: Physical Therapy

## 2022-07-30 ENCOUNTER — Encounter: Payer: 59 | Admitting: Physical Therapy

## 2022-08-01 ENCOUNTER — Encounter: Payer: 59 | Admitting: Physical Therapy

## 2022-08-07 ENCOUNTER — Ambulatory Visit: Payer: 59 | Admitting: Surgical

## 2022-08-13 DIAGNOSIS — Z03818 Encounter for observation for suspected exposure to other biological agents ruled out: Secondary | ICD-10-CM | POA: Diagnosis not present

## 2022-08-13 DIAGNOSIS — J029 Acute pharyngitis, unspecified: Secondary | ICD-10-CM | POA: Diagnosis not present

## 2022-08-24 ENCOUNTER — Ambulatory Visit (INDEPENDENT_AMBULATORY_CARE_PROVIDER_SITE_OTHER): Payer: 59

## 2022-08-24 ENCOUNTER — Other Ambulatory Visit (HOSPITAL_COMMUNITY)
Admission: RE | Admit: 2022-08-24 | Discharge: 2022-08-24 | Disposition: A | Discharge status: Home / Self Care | Payer: 59 | Source: Ambulatory Visit | Attending: Obstetrics and Gynecology | Admitting: Obstetrics and Gynecology

## 2022-08-24 VITALS — BP 120/80 | Ht 72.0 in | Wt 149.0 lb

## 2022-08-24 DIAGNOSIS — N898 Other specified noninflammatory disorders of vagina: Secondary | ICD-10-CM

## 2022-08-24 DIAGNOSIS — N760 Acute vaginitis: Secondary | ICD-10-CM

## 2022-08-24 MED ORDER — METRONIDAZOLE 500 MG PO TABS: 500.0000 mg | ORAL_TABLET | Freq: Two times a day (BID) | ORAL | 0 refills | Status: DC

## 2022-08-24 NOTE — Progress Notes (Signed)
    NURSE VISIT NOTE  Subjective:    Patient ID: Heather Kaiser, female    DOB: [birth year], 32 y.o.   MRN: [id number]  HPI  Patient is a 32 y.o. G0P0000 female who presents for  vaginal odor  for 5 day(s). Denies abnormal vaginal bleeding or significant pelvic pain or fever. denies dysuria, hematuria, urinary frequency, urinary urgency, flank pain, abdominal pain, pelvic pain, cloudy malordorous urine, genital rash, genital irritation, and vaginal discharge. Patient denies history of known exposure to STD. Pt has had history of BV, pretty sure this is what it is.    Objective:    There were no vitals taken for this visit.   @THIS  VISIT ONLY@  Assessment:   No diagnosis found.  bacterial vaginosis  Plan:   GC and chlamydia DNA  probe sent to lab. Treatment: Flagyl 500 BID x 7 days and abstain from coitus during course of treatment ROV prn if symptoms persist or worsen.   Cornelius Moras, CMA

## 2022-08-24 NOTE — Patient Instructions (Signed)

## 2022-08-27 ENCOUNTER — Telehealth: Payer: Self-pay

## 2022-08-27 LAB — CERVICOVAGINAL ANCILLARY ONLY
Bacterial Vaginitis (gardnerella): POSITIVE — AB
Candida Glabrata: NEGATIVE
Candida Vaginitis: NEGATIVE
Chlamydia: NEGATIVE
Comment: NEGATIVE
Comment: NEGATIVE
Comment: NEGATIVE
Comment: NEGATIVE
Comment: NEGATIVE
Comment: NORMAL
Neisseria Gonorrhea: NEGATIVE
Trichomonas: NEGATIVE

## 2022-08-27 NOTE — Telephone Encounter (Signed)
Called pt to inform her of BV results. Pt is aware of results and already started on medication.

## 2022-08-28 ENCOUNTER — Ambulatory Visit: Payer: 59 | Admitting: Surgical

## 2022-09-13 ENCOUNTER — Telehealth: Payer: Self-pay

## 2022-09-13 MED ORDER — FLUCONAZOLE 150 MG PO TABS: 150.0000 mg | ORAL_TABLET | Freq: Once | ORAL | 0 refills | Status: AC

## 2022-09-13 NOTE — Telephone Encounter (Signed)
Patient contacted office stating that she was recently prescribed antibiotic to treat for BV and is now having symptoms of vaginal itching and thick clumpy white discharge. Advised patient that I will send in antibiotic to treat symptoms of yeast. Heather Kaiser

## 2022-09-27 ENCOUNTER — Ambulatory Visit (INDEPENDENT_AMBULATORY_CARE_PROVIDER_SITE_OTHER): Payer: 59 | Admitting: Advanced Practice Midwife

## 2022-09-27 ENCOUNTER — Encounter: Payer: Self-pay | Admitting: Advanced Practice Midwife

## 2022-09-27 VITALS — BP 132/82 | Ht 72.0 in | Wt 153.0 lb

## 2022-09-27 DIAGNOSIS — Z538 Procedure and treatment not carried out for other reasons: Secondary | ICD-10-CM

## 2022-09-27 DIAGNOSIS — Z3009 Encounter for other general counseling and advice on contraception: Secondary | ICD-10-CM | POA: Diagnosis not present

## 2022-09-27 DIAGNOSIS — Z3041 Encounter for surveillance of contraceptive pills: Secondary | ICD-10-CM

## 2022-09-27 MED ORDER — NORETHIN ACE-ETH ESTRAD-FE 1.5-30 MG-MCG PO TABS: 1.0000 | ORAL_TABLET | Freq: Every day | ORAL | 4 refills | Status: DC

## 2022-09-27 MED ORDER — LEVONORGESTREL 20 MCG/DAY IU IUD: 1.0000 | INTRAUTERINE_SYSTEM | Freq: Once | INTRAUTERINE | Status: DC

## 2022-09-27 NOTE — Progress Notes (Signed)
Patient ID: Heather Kaiser, female   DOB: [birth year], 32 y.o.   MRN: [id number]  Reason for Visit: Consult (Want any BC that will stop her period. She has been on the pill and kylenna in the past, she spotted the whole year with the kylenna. )   Subjective:  HPI:  Heather Kaiser is a 32 y.o. female being seen to discuss birth control options. Most recently she has been using pull out method for the past 9 months. Prior she was on OCP and she also had Skyla from 2013 to 2016. She reports her moods were more steady since being off birth control. She has a regular monthly period. Last PAP was LSIL with positive HR HPV 9 months ago. Colpo 10 months ago notes low grade dysplasia and benign cervical glandular mucosa. Dr Marice Potter recommended annual PAP smear. She vapes.  After reviewing the various options, she is most interested in Mirena IUD. She understands that the combination of Estrogen and Nicotine increases her risk for CVD.   Placement of IUD was attempted, however, I was unable to pass the uterine sound through the patient's cervix- reading 3.5 cm and meeting hard resistance. LMP 09/23/22. She was then uncertain about having an IUD and she now prefers to take OCP. We discussed if she decides to try again for IUD she can take a dose of cytotec prior to the procedure.   Past Medical History:  Diagnosis Date   Anemia 2019   Anxiety    Fibroadenoma of breast, left    Fibroadenoma of breast, right 2020   per u/s   Scoliosis    Family History  Problem Relation Age of Onset   Depression Mother    Depression Father    Alcohol abuse Father    Mental illness Sister    Thyroid cancer Maternal Grandmother    Bipolar disorder Half-Sister    ADD / ADHD Half-Sister    Breast cancer Neg Hx    Ovarian cancer Neg Hx    Past Surgical History:  Procedure Laterality Date   BREAST BIOPSY      Short Social History:  Social History   Tobacco Use   Smoking status: Never   Smokeless tobacco: Never   Substance Use Topics   Alcohol use: Yes    Alcohol/week: 42.0 standard drinks of alcohol    Types: 42 Shots of liquor per week    Comment: heavy abuse - 1 bottle last 2-2.5 days    No Known Allergies  Current Outpatient Medications  Medication Sig Dispense Refill   metroNIDAZOLE (FLAGYL) 500 MG tablet Take 1 tablet (500 mg total) by mouth 2 (two) times daily. (Patient not taking: Reported on 09/27/2022) 14 tablet 0   omeprazole (PRILOSEC OTC) 20 MG tablet Take 1 tablet (20 mg total) by mouth daily. (Patient not taking: Reported on 09/27/2022)     No current facility-administered medications for this visit.    Review of Systems  Constitutional:  Negative for chills and fever.  HENT:  Negative for congestion, ear discharge, ear pain, hearing loss, sinus pain and sore throat.   Eyes:  Negative for blurred vision and double vision.  Respiratory:  Negative for cough, shortness of breath and wheezing.   Cardiovascular:  Negative for chest pain, palpitations and leg swelling.  Gastrointestinal:  Positive for constipation. Negative for abdominal pain, blood in stool, diarrhea, heartburn, melena, nausea and vomiting.  Genitourinary:  Negative for dysuria, flank pain, frequency, hematuria and urgency.  Musculoskeletal:  Negative for  back pain, joint pain and myalgias.  Skin:  Negative for itching and rash.  Neurological:  Negative for dizziness, tingling, tremors, sensory change, speech change, focal weakness, seizures, loss of consciousness, weakness and headaches.  Endo/Heme/Allergies:  Negative for environmental allergies. Does not bruise/bleed easily.  Psychiatric/Behavioral:  Negative for depression, hallucinations, memory loss, substance abuse and suicidal ideas. The patient is not nervous/anxious and does not have insomnia.         Objective:  Objective   Vitals:   09/27/22 1528  BP: 132/82  Weight: 153 lb (69.4 kg)  Height: 6' (1.829 m)   Body mass index is 20.75  kg/m. Constitutional: Well nourished, well developed female in no acute distress.  HEENT: normal Skin: Warm and dry.  Cardiovascular: Regular rate and rhythm.   Extremity:  no edema   Respiratory: Clear to auscultation bilateral. Normal respiratory effort Psych: Alert and Oriented x3. No memory deficits. Normal mood and affect.    Pelvic exam: (female chaperone present) is not limited by body habitus EGBUS: within normal limits Vagina: within normal limits and with normal mucosa  Cervix: grossly normal appearance   GYNECOLOGY OFFICE PROCEDURE NOTE  Heather Kaiser is a 32 y.o. G0P0000 here for Mirena IUD insertion. No GYN concerns.  Last pap smear was on 01/04/2022 and was LSIL/+HR HPV.  The patient is currently using pull out for contraception and her LMP is Patient's last menstrual period was 09/23/2022.Marland Kitchen  The indication for her IUD is contraception/cycle control.  IUD Insertion Procedure Note Patient identified, informed consent performed, consent signed.   Discussed risks of irregular bleeding, cramping, infection, malpositioning, expulsion or uterine perforation of the IUD (1:1000 placements)  which may require further procedure such as laparoscopy.  IUD while effective at preventing pregnancy do not prevent transmission of sexually transmitted diseases and use of barrier methods for this purpose was discussed. Time out was performed.    Speculum placed in the vagina.  Cervix visualized.  Cleaned with Betadine x 2.  Grasped anteriorly with a single tooth tenaculum.  Unable to pass sound through cervix. Hard resistance met at 3.5 cm. Discontinued procedure.   Patient had a mild vagal reaction and recovered well.   Assessment/Plan:     32 y.o. G0 P0 female, desiring contraception, failed attempt to insert IUD  Rx OCP [place] Fe Call on period if IUD desired- will send Rx for cytotec pre procedure  Tresea Mall CNM [facility] 09/27/2022, 5:14 PM

## 2022-10-16 ENCOUNTER — Encounter: Payer: Self-pay | Admitting: Obstetrics and Gynecology

## 2022-10-16 ENCOUNTER — Encounter: Payer: Self-pay | Admitting: Certified Nurse Midwife

## 2022-10-16 ENCOUNTER — Other Ambulatory Visit: Payer: Self-pay | Admitting: Certified Nurse Midwife

## 2022-10-16 ENCOUNTER — Telehealth: Payer: Self-pay

## 2022-10-16 MED ORDER — ETONOGESTREL-ETHINYL ESTRADIOL 0.12-0.015 MG/24HR VA RING: VAGINAL_RING | VAGINAL | 3 refills | Status: DC

## 2022-10-16 NOTE — Telephone Encounter (Signed)
Pt calling; was put on bcp because IUD wouldn't go in; the bcp are making her feel weird and she is requesting something else not hormonal; maybe the ring? Pt states she is not comfortable continuing these pills. [phone]

## 2022-10-17 ENCOUNTER — Other Ambulatory Visit: Payer: Self-pay | Admitting: Obstetrics and Gynecology

## 2022-10-17 DIAGNOSIS — Z3041 Encounter for surveillance of contraceptive pills: Secondary | ICD-10-CM

## 2022-10-19 ENCOUNTER — Other Ambulatory Visit: Payer: Self-pay | Admitting: Obstetrics and Gynecology

## 2022-10-19 DIAGNOSIS — Z3041 Encounter for surveillance of contraceptive pills: Secondary | ICD-10-CM

## 2022-11-05 NOTE — Telephone Encounter (Signed)
I contacted the patient via phone, I left voicemail for the patient to call back to schedule.

## 2022-11-06 ENCOUNTER — Ambulatory Visit (INDEPENDENT_AMBULATORY_CARE_PROVIDER_SITE_OTHER): Payer: Commercial Managed Care - PPO

## 2022-11-06 ENCOUNTER — Other Ambulatory Visit (HOSPITAL_COMMUNITY)
Admission: RE | Admit: 2022-11-06 | Discharge: 2022-11-06 | Disposition: A | Discharge status: Home / Self Care | Payer: Commercial Managed Care - PPO | Source: Ambulatory Visit | Attending: Obstetrics and Gynecology | Admitting: Obstetrics and Gynecology

## 2022-11-06 VITALS — BP 102/70 | Ht 72.0 in | Wt 154.0 lb

## 2022-11-06 DIAGNOSIS — N898 Other specified noninflammatory disorders of vagina: Secondary | ICD-10-CM | POA: Insufficient documentation

## 2022-11-06 LAB — POCT URINALYSIS DIPSTICK
Bilirubin, UA: NEGATIVE
Blood, UA: NEGATIVE
Glucose, UA: NEGATIVE
Ketones, UA: NEGATIVE
Leukocytes, UA: NEGATIVE
Nitrite, UA: NEGATIVE
Protein, UA: NEGATIVE
Urobilinogen, UA: 0.2 E.U./dL
pH, UA: 8 (ref 5.0–8.0)

## 2022-11-06 MED ORDER — FLUCONAZOLE 150 MG PO TABS: 150.0000 mg | ORAL_TABLET | Freq: Once | ORAL | 0 refills | Status: AC

## 2022-11-06 NOTE — Progress Notes (Signed)
    NURSE VISIT NOTE  Subjective:    Patient ID: Charee Vasseur, female    DOB: 06-04-1990, 32 y.o.   MRN: [id number]  HPI  Patient is a 32 y.o. G0P0000 female who presents for a self swab nurse visit. Patient has been having internal vaginal swelling/irritation, redness. Noticed these symptoms a few days after intercourse. Denies abnormal vaginal bleeding or significant pelvic pain or fever. Dipped urine in office and it was normal.   Objective:    BP 102/70   Ht 6' (1.829 m)   Wt 154 lb (69.9 kg)   BMI 20.89 kg/m     Assessment:   1. Vaginal irritation      Plan:   Aptima sent to lab. Treatment: Per [place] [facility], PA sent diflucan. ROV prn if symptoms persist or worsen.   Donnetta Hail, CMA

## 2022-11-06 NOTE — Patient Instructions (Signed)
Vaginitis  Vaginitis is irritation and swelling of the vagina. Treatment will depend on the cause. What are the causes? It can be caused by: Bacteria. Yeast. A parasite. A virus. Low hormone levels. Bubble baths, scented tampons, and feminine sprays. Other things can change the balance of the yeast and bacteria that live in the vagina. These include: Antibiotic medicines. Not being clean enough. Some birth control methods. Sex. Infection. Diabetes. A weakened body defense system (immune system). What increases the risk? Smoking or being around someone who smokes. Using washes (douches), scented tampons, or scented pads. Wearing tight pants or thong underwear. Using birth control pills or an IUD. Having sex without a condom or having a lot of partners. Having an STI. Using a certain product to kill sperm (nonoxynol-9). Eating foods that are high in sugar. Having diabetes. Having low levels of a female hormone. Having a weakened body defense system. Being pregnant or breastfeeding. What are the signs or symptoms? Fluid coming from the vagina that is not normal. A bad smell. Itching, pain, or swelling. Pain with sex. Pain or burning when you pee (urinate). Sometimes there are no symptoms. How is this treated? Treatment may include: Antibiotic creams or pills. Antifungal medicines. Medicines to ease symptoms if you have a virus. Your sex partner should also be treated. Estrogen medicines. Avoiding scented soaps, sprays, or douches. Stopping use of products that caused irritation and then using a cream to treat symptoms. Follow these instructions at home: Lifestyle Keep the area around your vagina clean and dry. Avoid using soap. Rinse the area with water. Until your doctor says it is okay: Do not use washes for the vagina. Do not use tampons. Do not have sex. Wipe from front to back after going to the bathroom. When your doctor says it is okay, practice safe sex  and use condoms. General instructions Take over-the-counter and prescription medicines only as told by your doctor. If you were prescribed an antibiotic medicine, take or use it as told by your doctor. Do not stop taking or using it even if you start to feel better. Keep all follow-up visits. How is this prevented? Do not use things that can irritate the vagina, such as fabric softeners. Avoid these products if they are scented: Sprays. Detergents. Tampons. Products for cleaning the vagina. Soaps or bubble baths. Let air reach your vagina. To do this: Wear cotton underwear. Do not wear: Underwear while you sleep. Tight pants. Thong underwear. Underwear or nylons without a cotton panel. Take off any wet clothing, such as bathing suits, as soon as you can. Practice safe sex and use condoms. Contact a doctor if: You have pain in your belly or in the area between your hips. You have a fever or chills. Your symptoms last for more than 2-3 days. Get help right away if: You have a fever and your symptoms get worse all of a sudden. Summary Vaginitis is irritation and swelling of the vagina. Treatment will depend on the cause of the condition. Do not use washes or tampons or have sex until your doctor says it is okay. This information is not intended to replace advice given to you by your health care provider. Make sure you discuss any questions you have with your health care provider. Document Revised: 11/05/2019 Document Reviewed: 11/05/2019 Elsevier Patient Education  2024 Elsevier Inc.  

## 2022-11-07 ENCOUNTER — Telehealth: Payer: Self-pay

## 2022-11-07 LAB — CERVICOVAGINAL ANCILLARY ONLY
Bacterial Vaginitis (gardnerella): NEGATIVE
Candida Glabrata: NEGATIVE
Candida Vaginitis: POSITIVE — AB
Comment: NEGATIVE
Comment: NEGATIVE
Comment: NEGATIVE

## 2022-11-07 NOTE — Telephone Encounter (Signed)
Pt calling again; is in a lot of pain and discomfort; really hurts to pee; is VERY irritated; needs to know what to do; is considering going to the ER.  [phone]  Adv pt if in that much pain she would be better served by going to PCP, UC, or ER as we have no availability today and she cannot wait until tomorrow or later to be seen by [place].  Pt appreciative of call.

## 2022-11-07 NOTE — Telephone Encounter (Signed)
Pls f/u with pt to see how she's doing. Culture showed yeast and you gave her diflucan. If sx severe, may need a 2nd dose if she's still having issues. Can also try OTC hydrocortisone crm ext 2-3 times daily for pain. Any sores vaginally?

## 2022-11-07 NOTE — Telephone Encounter (Signed)
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  [phone]

## 2022-11-08 ENCOUNTER — Encounter: Payer: Self-pay | Admitting: Obstetrics and Gynecology

## 2022-11-08 ENCOUNTER — Telehealth: Payer: Self-pay | Admitting: Obstetrics and Gynecology

## 2022-11-08 ENCOUNTER — Ambulatory Visit: Payer: Commercial Managed Care - PPO | Admitting: Obstetrics and Gynecology

## 2022-11-08 ENCOUNTER — Other Ambulatory Visit: Payer: Self-pay | Admitting: Obstetrics and Gynecology

## 2022-11-08 VITALS — BP 100/74 | Ht 72.0 in | Wt 152.0 lb

## 2022-11-08 DIAGNOSIS — A6004 Herpesviral vulvovaginitis: Secondary | ICD-10-CM | POA: Diagnosis not present

## 2022-11-08 MED ORDER — VALACYCLOVIR HCL 1 G PO TABS: 1000.0000 mg | ORAL_TABLET | Freq: Two times a day (BID) | ORAL | 0 refills | Status: DC

## 2022-11-08 NOTE — Telephone Encounter (Signed)
Pt has appt with [facility] today at 11:15 am.

## 2022-11-08 NOTE — Telephone Encounter (Signed)
Spoke to pt. Has appt at 11:15 today

## 2022-11-08 NOTE — Patient Instructions (Signed)
I value your feedback and you entrusting us with your care. If you get a [facility] patient survey, I would appreciate you taking the time to let us know about your experience today. Thank you! ? ? ?

## 2022-11-08 NOTE — Telephone Encounter (Signed)
Called pt, no answer, LVMTRC. 

## 2022-11-08 NOTE — Progress Notes (Addendum)
[facility], [employer] production planner Complaint  Patient presents with   Follow-up    HPI:      [name] is a 32 y.o. G0P0000 whose LMP was No LMP recorded. (Menstrual status: Other)., presents today for f/u on vaginal pain/swelling for a few days; sx getting worse. Also now with LAN in groin, HA, nausea, dysuria . Came in for self swab 2 days ago due to vag sx only. Culture confirmed yeast, pt treated with diflucan that day. Sx worse yesterday and pt went to PCP. States had neg GYN exam, neg yeast, neg STD testing. Being treated for UTI with cephalexin, awaiting C&S results.  Pt with new sexual partner x 3 months. No hx of HSV/cold sores.   Patient Active Problem List   Diagnosis Date Noted   Back pain 05/25/2022   Symptomatic mammary hypertrophy 05/25/2022   Hypokalemia 02/04/2022   Hypophosphatemia 02/03/2022   Alcoholic ketoacidosis 02/02/2022   Alcohol withdrawal syndrome (HCC) 02/02/2022   Hematemesis 02/02/2022   AKI (acute kidney injury) (HCC) 02/02/2022   Elevated lipase 02/02/2022   Hypercalcemia 02/02/2022   Hypomagnesemia 02/02/2022   Anxiety disorder 12/21/2021   Depression 12/21/2021   Alcohol use disorder, moderate, dependence (HCC) 12/21/2021   Eating disorder 12/21/2021   History of ADHD 12/21/2021   Iron deficiency anemia 09/04/2018    Past Surgical History:  Procedure Laterality Date   BREAST BIOPSY      Family History  Problem Relation Age of Onset   Depression Mother    Depression Father    Alcohol abuse Father    Mental illness Sister    Thyroid cancer Maternal Grandmother    Bladder Cancer Maternal Grandfather    Bipolar disorder Half-Sister    ADD / ADHD Half-Sister    Breast cancer Neg Hx    Ovarian cancer Neg Hx     Social History   Socioeconomic History   Marital status: Single    Spouse name: Not on file   Number of children: Not on file   Years of education: Not on file   Highest education level: Some college, no degree   Occupational History   Not on file  Tobacco Use   Smoking status: Never   Smokeless tobacco: Never  Vaping Use   Vaping Use: Former  Substance and Sexual Activity   Alcohol use: Yes    Alcohol/week: 42.0 standard drinks of alcohol    Types: 42 Shots of liquor per week    Comment: heavy abuse - 1 bottle last 2-2.5 days   Drug use: No   Sexual activity: Yes    Birth control/protection: Inserts    Comment: Nuvaring  Other Topics Concern   Not on file  Social History Narrative   Not on file   Social Determinants of Health   Financial Resource Strain: Not on file  Food Insecurity: Not on file  Transportation Needs: Not on file  Physical Activity: Not on file  Stress: Not on file  Social Connections: Not on file  Intimate Partner Violence: Not on file    Outpatient Medications Prior to Visit  Medication Sig Dispense Refill   cephALEXin (KEFLEX) 500 MG capsule Take by mouth.     etonogestrel-ethinyl estradiol (NUVARING) 0.12-0.015 MG/24HR vaginal ring Insert vaginally and leave in place for 3 consecutive weeks, then remove for 1 week. 3 each 3   No facility-administered medications prior to visit.      ROS:  Review of Systems  Constitutional:  Negative for fever.  Gastrointestinal:  Negative for blood in stool, constipation, diarrhea, nausea and vomiting.  Genitourinary:  Positive for dysuria, genital sores and vaginal pain. Negative for dyspareunia, flank pain, frequency, hematuria, urgency, vaginal bleeding and vaginal discharge.  Musculoskeletal:  Negative for back pain.  Skin:  Negative for rash.   BREAST: No symptoms   OBJECTIVE:   Vitals:  BP 100/74   Ht 6' (1.829 m)   Wt 152 lb (68.9 kg)   BMI 20.61 kg/m   Physical Exam Vitals reviewed.  Constitutional:      Appearance: She is well-developed.  Pulmonary:     Effort: Pulmonary effort is normal.  Genitourinary:    Labia:        Right: Lesion present. No rash or tenderness.        Left: Lesion  present. No rash or tenderness.        Comments: PAINFUL ULCERATIVE LESIONS BILAT LABIA; WITH D/C Musculoskeletal:        General: Normal range of motion.     Cervical back: Normal range of motion.  Lymphadenopathy:     Lower Body: Right inguinal adenopathy present. Left inguinal adenopathy present.  Skin:    General: Skin is warm and dry.  Neurological:     General: No focal deficit present.     Mental Status: She is alert and oriented to person, place, and time.     Cranial Nerves: No cranial nerve deficit.  Psychiatric:        Mood and Affect: Mood normal.        Behavior: Behavior normal.        Thought Content: Thought content normal.        Judgment: Judgment normal.     Assessment/Plan: Herpes simplex vulvovaginitis - Plan: valACYclovir (VALTREX) 1000 MG tablet, HSV NAA; pos sx and exam, check culture. Rx valtrex; sitz baths, NSAIDs prn sx. Will f/u with results. Discussed daily vs episodic tx as well as partner testing.    Meds ordered this encounter  Medications   valACYclovir (VALTREX) 1000 MG tablet    Sig: Take 1 tablet (1,000 mg total) by mouth 2 (two) times daily.    Dispense:  20 tablet    Refill:  0    Order Specific Question:   Supervising Provider    Answer:   Hildred Laser [[id number]]      Return if symptoms worsen or fail to improve.  Patton Swisher B. Ethen Bannan, PA-C 11/08/2022 11:30 AM

## 2022-11-08 NOTE — Telephone Encounter (Signed)
Pt called Triage with following symptoms-swollen lymph nodes, headache, nausea, hurts to pee, and vaginal area is very red.  Pt had a nurse visit on 6/18.  She was given meds for a yeast infection.  Pt stated that she started taking an antibiotic.  She saw her PCP yesterday as well.  States that the test results are conflicting, the one from [place] and her PCP are conflicting.  She would like for you to look at her test results to see what is going on.  States that she is in great discomfort.  Did not necessarily want an appt.  Her number is [phone].

## 2022-11-08 NOTE — Telephone Encounter (Signed)
Pt came in for appt. 

## 2022-11-10 ENCOUNTER — Other Ambulatory Visit: Payer: Self-pay | Admitting: Obstetrics and Gynecology

## 2022-11-10 ENCOUNTER — Encounter: Payer: Self-pay | Admitting: Obstetrics and Gynecology

## 2022-11-10 MED ORDER — LIDOCAINE 5 % EX OINT: 1.0000 | TOPICAL_OINTMENT | CUTANEOUS | 0 refills | Status: DC | PRN

## 2022-11-10 NOTE — Progress Notes (Signed)
Rx lidocaine for HSV

## 2022-11-11 LAB — HSV NAA
HSV 1 NAA: NEGATIVE
HSV 2 NAA: POSITIVE — AB

## 2022-11-12 MED ORDER — VALACYCLOVIR HCL 500 MG PO TABS: 500.0000 mg | ORAL_TABLET | Freq: Every day | ORAL | 1 refills | Status: DC

## 2022-11-15 ENCOUNTER — Encounter: Payer: Self-pay | Admitting: Obstetrics and Gynecology

## 2022-11-19 MED ORDER — PHENAZOPYRIDINE HCL 200 MG PO TABS: 200.0000 mg | ORAL_TABLET | Freq: Three times a day (TID) | ORAL | 0 refills | Status: DC | PRN

## 2022-11-20 ENCOUNTER — Telehealth: Payer: Self-pay | Admitting: Obstetrics and Gynecology

## 2022-11-20 DIAGNOSIS — R339 Retention of urine, unspecified: Secondary | ICD-10-CM

## 2022-11-20 NOTE — Telephone Encounter (Signed)
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 Started on valtrex. Then had urinary retention with neg C&S 11/15/22 and went to ED. Was catheterized, sent to urology. Has appt 11/27/22. Have large blood clots in catheter now, pt very concerned. Will try to get in urgent urology ref.

## 2022-11-30 ENCOUNTER — Other Ambulatory Visit: Payer: Self-pay

## 2022-11-30 ENCOUNTER — Emergency Department
Admission: EM | Admit: 2022-11-30 | Discharge: 2022-11-30 | Disposition: A | Discharge status: Home / Self Care | Payer: Commercial Managed Care - PPO | Attending: Emergency Medicine | Admitting: Emergency Medicine

## 2022-11-30 ENCOUNTER — Emergency Department: Payer: Commercial Managed Care - PPO

## 2022-11-30 DIAGNOSIS — R531 Weakness: Secondary | ICD-10-CM | POA: Insufficient documentation

## 2022-11-30 DIAGNOSIS — Z1152 Encounter for screening for COVID-19: Secondary | ICD-10-CM | POA: Insufficient documentation

## 2022-11-30 LAB — URINALYSIS, ROUTINE W REFLEX MICROSCOPIC
Bilirubin Urine: NEGATIVE
Glucose, UA: NEGATIVE mg/dL
Hgb urine dipstick: NEGATIVE
Ketones, ur: NEGATIVE mg/dL
Leukocytes,Ua: NEGATIVE
Nitrite: NEGATIVE
Protein, ur: NEGATIVE mg/dL
Specific Gravity, Urine: 1.008 (ref 1.005–1.030)
pH: 6 (ref 5.0–8.0)

## 2022-11-30 LAB — SARS CORONAVIRUS 2 BY RT PCR: SARS Coronavirus 2 by RT PCR: NEGATIVE

## 2022-11-30 LAB — BASIC METABOLIC PANEL
Anion gap: 9 (ref 5–15)
BUN: 18 mg/dL (ref 6–20)
CO2: 24 mmol/L (ref 22–32)
Calcium: 9.1 mg/dL (ref 8.9–10.3)
Chloride: 104 mmol/L (ref 98–111)
Creatinine, Ser: 0.81 mg/dL (ref 0.44–1.00)
GFR, Estimated: >60 mL/min (ref >60)
Glucose, Bld: 95 mg/dL (ref 70–99)
Potassium: 4.2 mmol/L (ref 3.5–5.1)
Sodium: 137 mmol/L (ref 135–145)

## 2022-11-30 LAB — CBC
HCT: 42.1 % (ref 36.0–46.0)
Hemoglobin: 13.8 g/dL (ref 12.0–15.0)
MCH: 28 pg (ref 26.0–34.0)
MCHC: 32.8 g/dL (ref 30.0–36.0)
MCV: 85.4 fL (ref 80.0–100.0)
Platelets: 388 10*3/uL (ref 150–400)
RBC: 4.93 MIL/uL (ref 3.87–5.11)
RDW: 13.2 % (ref 11.5–15.5)
WBC: 8 10*3/uL (ref 4.0–10.5)
nRBC: 0 % (ref 0.0–0.2)

## 2022-11-30 LAB — TSH: TSH: 1.495 u[IU]/mL (ref 0.350–4.500)

## 2022-11-30 LAB — POC URINE PREG, ED: Preg Test, Ur: NEGATIVE

## 2022-11-30 MED ORDER — SODIUM CHLORIDE 0.9 % IV BOLUS
1000.0000 mL | Freq: Once | INTRAVENOUS | Status: AC
  Administered 2022-11-30: 1000 mL via INTRAVENOUS

## 2022-11-30 NOTE — ED Provider Notes (Signed)
[facility] Provider Note    Event Date/Time   First MD Initiated Contact with Patient 11/30/22 1105     (approximate)   History   Weakness   HPI  Heather Kaiser is a 32 y.o. female with a history of ADHD, anxiety, and iron deficiency anemia who presents with generalized weakness since she awoke this morning, associated with some lightheadedness as well as shortness of breath and tightness in her chest.  The patient denies any fever or chills.  She has had some nausea but no vomiting.  She denies any diarrhea but has been constipated.  She denies any abdominal pain.  She has no dysuria, frequency, or hematuria.  She has no headache.  The patient has had a complicated UTI course over the last several weeks.  She has been on several antibiotics.  She was switched from cefdinir to Bactrim yesterday and took 2 doses, once last night and once this morning.  I reviewed the past medical records.  The patient has had several recent medical encounters.  She was seen by internal medicine at [facility] on 6/19 for vaginal discomfort and dysuria..  She had a positive urinalysis and was started on cephalexin at that time.  She was also started on Valtrex for herpes vulvovaginitis on 6/20.  She was seen at the [facility] ED on 6/27 for urinary retention which was resolved with a catheter and thought to be related to medication.  She presented again on 6/30 for Foley obstruction and it was flushed.  She was then started on nitrofurantoin due to a positive urine culture.  She presented to the ED again on 7/7 for discomfort at the site of the Foley.  Foley was removed and the patient passed a trial of void.  Her urinalysis was concerning for acute infection and she was started on cefdinir.  Urine culture from 7/8 shows Klebsiella aerogines.  Both cultures are marked from the Foley catheter, however the patient states that 1 was taken from the catheter and 1 was taken from a fresh sample.  One  of the samples shows greater than 100,000 CFU and the other shows 10-50,000.  The patient states that based on this culture she was switched to the Bactrim yesterday.   Physical Exam   Triage Vital Signs: ED Triage Vitals  Encounter Vitals Group     BP 11/30/22 1013 124/62     Systolic BP Percentile --      Diastolic BP Percentile --      Pulse Rate 11/30/22 1013 92     Resp 11/30/22 1013 18     Temp 11/30/22 1014 98.4 F (36.9 C)     Temp Source 11/30/22 1013 Oral     SpO2 11/30/22 1013 100 %     Weight 11/30/22 1013 151 lb 14.4 oz (68.9 kg)     Height 11/30/22 1013 6' (1.829 m)     Head Circumference --      Peak Flow --      Pain Score 11/30/22 1013 0     Pain Loc --      Pain Education --      Exclude from Growth Chart --     Most recent vital signs: Vitals:   11/30/22 1013 11/30/22 1014  BP: 124/62   Pulse: 92   Resp: 18   Temp:  98.4 F (36.9 C)  SpO2: 100%      General: Alert, well-appearing, no distress.  CV:  Good peripheral  perfusion.  Normal heart sounds. Resp:  Normal effort. Lungs CTAB. Abd:  Soft and nontender.  No distention.  Other:  Slightly dry mucous membranes.  EOMI.  PERRLA.  No facial droop.  No pronator drift.  No ataxia.  Motor intact in all extremities.   ED Results / Procedures / Treatments   Labs (all labs ordered are listed, but only abnormal results are displayed) Labs Reviewed  URINALYSIS, ROUTINE W REFLEX MICROSCOPIC - Abnormal; Notable for the following components:      Result Value   Color, Urine STRAW (*)    APPearance CLEAR (*)    All other components within normal limits  SARS CORONAVIRUS 2 BY RT PCR  URINE CULTURE  BASIC METABOLIC PANEL  CBC  TSH  POC URINE PREG, ED  CBG MONITORING, ED     EKG  ED ECG REPORT I, Dionne Bucy, the attending physician, personally viewed and interpreted this ECG.  Date: 11/30/2022 EKG Time: 1017 Rate: 76 Rhythm: normal sinus rhythm QRS Axis: Right axis Intervals:  normal ST/T Wave abnormalities: normal Narrative Interpretation: no evidence of acute ischemia    RADIOLOGY  Chest x-ray: I independently viewed and interpreted the images; there is no focal consolidation or edema  PROCEDURES:  Critical Care performed: No  Procedures   MEDICATIONS ORDERED IN ED: Medications  sodium chloride 0.9 % bolus 1,000 mL (1,000 mLs Intravenous New Bag/Given 11/30/22 1218)     IMPRESSION / MDM / ASSESSMENT AND PLAN / ED COURSE  I reviewed the triage vital signs and the nursing notes.  32 year old female with PMH as noted above and status post recent complicated course of UTI and herpes vulvovaginitis presents with generalized weakness, malaise, as well as some shortness of breath and chest tightness since starting on Bactrim last night.  This is now her fourth antibiotic.  On exam she is well-appearing and alert with normal vital signs.  Neuroexam is nonfocal.  EKG shows no acute abnormality.  Basic labs are reassuring with normal creatinine, no leukocytosis or anemia, and a negative urinalysis.  Differential diagnosis includes, but is not limited to, side effects of Bactrim, dehydration, viral syndrome unrelated to the recent UTI, versus less likely persistent symptoms related to UTI.  At this time I do not see any clinical evidence for active UTI.  The patient denies any dysuria or other urinary symptoms.  Her vital signs are normal.  She has no leukocytosis, and negative urinalysis today.  She had already been on the cefdinir for 4 days prior to being started on the Bactrim.  We will obtain TSH, urine culture, COVID swab, chest x-ray, give a fluid bolus, and reassess.  I will also discuss case with infectious disease for further recommendations.  Patient's presentation is most consistent with acute complicated illness / injury requiring diagnostic workup.  ----------------------------------------- 12:23 PM on  11/30/2022 -----------------------------------------  I consulted and discussed the case with Dr. Joylene Draft from infectious disease.  She advises that based on the clinical and lab findings described above, she would discontinue antibiotics at this time.  ----------------------------------------- 2:06 PM on 11/30/2022 -----------------------------------------  Chest x-ray is clear.  COVID is negative.  TSH is normal.  Urine culture has been sent.   The patient is feeling somewhat better after fluids.  She is stable for discharge at this time.  I counseled her on the results of the workup and on ID recommendations.  She has been instructed to discontinue the antibiotic.  I gave strict return precautions and she expressed  understanding.   FINAL CLINICAL IMPRESSION(S) / ED DIAGNOSES   Final diagnoses:  Generalized weakness     Rx / DC Orders   ED Discharge Orders     None        Note:  This document was prepared using Dragon voice recognition software and may include unintentional dictation errors.    Dionne Bucy, MD 11/30/22 1407

## 2022-11-30 NOTE — Discharge Instructions (Signed)
As discussed, discontinue the antibiotics.  Make sure to drink plenty of fluids and get some rest over the next few days.  If your culture comes back positive you will be called with further instructions.  Follow-up with your primary care provider and with urology as scheduled.  In the meantime, return to the ER immediately for new, worsening, or persistent severe weakness or lethargy, fever, recurrent or worsening urinary symptoms, abdominal or flank pain, vomiting, or any other new or worsening symptoms that concern you.

## 2022-11-30 NOTE — ED Triage Notes (Signed)
Pt here with weakness. Pt went to [facility] x3 weeks ago with urinary retention, last visit on Friday. Pt was on abx, received a new abx yesterday, and has been altered and lethargic since then. Pt denies NVD.

## 2022-11-30 NOTE — ED Notes (Signed)
See triage notes. Patient has been fighting a UTI for three weeks. Patient has been on multiple different antibiotics.

## 2022-12-02 LAB — URINE CULTURE: Culture: NO GROWTH

## 2023-01-28 ENCOUNTER — Encounter: Payer: Self-pay | Admitting: Obstetrics and Gynecology

## 2023-02-04 ENCOUNTER — Encounter (HOSPITAL_COMMUNITY): Payer: Self-pay | Admitting: Licensed Clinical Social Worker

## 2023-02-04 ENCOUNTER — Telehealth (HOSPITAL_COMMUNITY): Payer: Self-pay | Admitting: Licensed Clinical Social Worker

## 2023-02-04 NOTE — Telephone Encounter (Signed)
The therapist receives the following email from Heather Kaiser:  "Hello, I don't know if you remember me but I started in your program about a year ago and I celebrated 1 year of sobriety Saturday and I want to thank you for all your help. I really enjoyed the program and it helped me get back out into the real world after being in such a fog. I hope you have a great week and just wanted to let you know you had a big impact on my sobriety. Thanks, Heather Kaiser"   The therapist responds to this email with a letter via MyChart.   Myrna Blazer, MA, LCSW, [facility], LCAS 02/04/2023

## 2023-03-09 ENCOUNTER — Other Ambulatory Visit: Payer: Self-pay | Admitting: Obstetrics and Gynecology

## 2023-03-09 DIAGNOSIS — A6004 Herpesviral vulvovaginitis: Secondary | ICD-10-CM

## 2023-03-25 ENCOUNTER — Other Ambulatory Visit: Payer: Self-pay | Admitting: Obstetrics and Gynecology

## 2023-03-25 DIAGNOSIS — A6004 Herpesviral vulvovaginitis: Secondary | ICD-10-CM

## 2023-03-25 MED ORDER — VALACYCLOVIR HCL 500 MG PO TABS
500.0000 mg | ORAL_TABLET | Freq: Every day | ORAL | 0 refills | Status: DC
Start: 2023-03-25 — End: 2023-04-02

## 2023-03-25 NOTE — Progress Notes (Signed)
Rx RF valtrex daily, has 11/24 annual

## 2023-04-01 DIAGNOSIS — R87612 Low grade squamous intraepithelial lesion on cytologic smear of cervix (LGSIL): Secondary | ICD-10-CM | POA: Insufficient documentation

## 2023-04-01 DIAGNOSIS — A6004 Herpesviral vulvovaginitis: Secondary | ICD-10-CM | POA: Insufficient documentation

## 2023-04-01 NOTE — Progress Notes (Unsigned)
PCP:  Nira Retort   No chief complaint on file.    HPI:      [name] is a 32 y.o. G0P0000 whose LMP was No LMP recorded. (Menstrual status: Other)., presents today for her annual examination.  Her menses are Q3 months with cont dosing OCPs, lasting 5 days, mod flow.  Dysmenorrhea mild to mod, improved somewhat with NSAIDs. She does not have intermenstrual bleeding .   Sex activity: not currently sexually active;  contraception - OCP (estrogen/progesterone).  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 Doing well now. Started probiotics. Didn't try boric acid since sx resolved.   There is no FH of breast cancer. There is no FH of ovarian cancer. The patient does do self-breast exams. Hx of stable RT breast fibroadenoma. Would like breast reduction, plans to see plastic surgeon.  Tobacco use: The patient denies current or previous tobacco use. Alcohol use: none No drug use.  Exercise: occas active  She does get adequate calcium and Vitamin D in her diet.   Past Medical History:  Diagnosis Date   Anemia 2019   Anxiety    Fibroadenoma of breast, left    Fibroadenoma of breast, right 2020   per u/s   Scoliosis     Past Surgical History:  Procedure Laterality Date   BREAST BIOPSY      Family History  Problem Relation Age of Onset   Depression Mother    Depression Father    Alcohol abuse Father    Mental illness Sister    Thyroid cancer Maternal Grandmother    Bladder Cancer Maternal Grandfather    Bipolar disorder Half-Sister    ADD / ADHD Half-Sister    Breast cancer Neg Hx    Ovarian cancer Neg Hx     Social History   Socioeconomic History   Marital status: Single    Spouse name: Not on file   Number of children: Not on file   Years of education: Not on file    Highest education level: Some college, no degree  Occupational History   Not on file  Tobacco Use   Smoking status: Never   Smokeless tobacco: Never  Vaping Use   Vaping status: Former  Substance and Sexual Activity   Alcohol use: Yes    Alcohol/week: 42.0 standard drinks of alcohol    Types: 42 Shots of liquor per week    Comment: heavy abuse - 1 bottle last 2-2.5 days   Drug use: No   Sexual activity: Yes    Birth control/protection: Inserts    Comment: Nuvaring  Other Topics Concern   Not on file  Social History Narrative   Not on file   Social Determinants of Health   Financial Resource Strain: Low Risk  (09/16/2019)   Received from [facility], [facility]   Overall Financial Resource Strain (CARDIA)    Difficulty of Paying Living Expenses: Not hard at all  Food Insecurity: No Food Insecurity (09/16/2019)   Received from [facility], [facility]   Hunger Vital Sign    Worried About Running Out of Food in the Last Year: Never true    Ran Out of Food in the Last Year: Never true  Transportation  Needs: No Transportation Needs (09/16/2019)   Received from [facility], [facility]   [facility] - Transportation    In the past 12 months, has lack of transportation kept you from medical appointments or from getting medications?: No    Lack of Transportation (Non-Medical): No  Physical Activity: Sufficiently Active (09/16/2019)   Received from [facility], [facility]   Exercise Vital Sign    Days of Exercise per Week: 7 days    Minutes of Exercise per Session: 60 min  Stress: No Stress Concern Present (09/16/2019)   Received from [facility], [facility]   [employer] [facility] Occupational [facility] - Occupational Stress Questionnaire    Feeling of Stress : Only a little  Social Connections: Moderately  Isolated (09/16/2019)   Received from [facility], [facility]   Social Connection and Isolation Panel [NHANES]    Frequency of Communication with Friends and Family: More than three times a week    Frequency of Social Gatherings with Friends and Family: Never    Attends Religious Services: Never    Database administrator or Organizations: Yes    Attends Engineer, structural: More than 4 times per year    Marital Status: Never married  Intimate Partner Violence: Not on file     Current Outpatient Medications:    lidocaine (XYLOCAINE) 5 % ointment, Apply 1 Application topically as needed., Disp: 30 g, Rfl: 0   etonogestrel-ethinyl estradiol (NUVARING) 0.12-0.015 MG/24HR vaginal ring, Insert vaginally and leave in place for 3 consecutive weeks, then remove for 1 week., Disp: 3 each, Rfl: 3   phenazopyridine (PYRIDIUM) 200 MG tablet, Take 1 tablet (200 mg total) by mouth 3 (three) times daily as needed for pain (urethral spasm)., Disp: 10 tablet, Rfl: 0   valACYclovir (VALTREX) 500 MG tablet, Take 1 tablet (500 mg total) by mouth daily., Disp: 90 tablet, Rfl: 0    ROS:  Review of Systems  Constitutional:  Negative for fatigue, fever and unexpected weight change.  Respiratory:  Negative for cough, shortness of breath and wheezing.   Cardiovascular:  Negative for chest pain, palpitations and leg swelling.  Gastrointestinal:  Negative for blood in stool, constipation, diarrhea, nausea and vomiting.  Endocrine: Negative for cold intolerance, heat intolerance and polyuria.  Genitourinary:  Negative for dyspareunia, dysuria, flank pain, frequency, genital sores, hematuria, menstrual problem, pelvic pain, urgency, vaginal bleeding, vaginal discharge and vaginal pain.  Musculoskeletal:  Negative for back pain, joint swelling and myalgias.  Skin:  Negative for rash.  Neurological:  Negative for dizziness, syncope, light-headedness, numbness and  headaches.  Hematological:  Negative for adenopathy.  Psychiatric/Behavioral:  Negative for agitation, confusion, sleep disturbance and suicidal ideas. The patient is not nervous/anxious.   BREAST: No symptoms   Objective: There were no vitals taken for this visit.   Physical Exam Constitutional:      Appearance: She is well-developed.  Genitourinary:     Vulva normal.     Right Labia: No rash, tenderness or lesions.    Left Labia: No tenderness, lesions or rash.    No vaginal discharge, erythema or tenderness.      Right Adnexa: not tender and no mass present.    Left Adnexa: not tender and no mass present.    No cervical friability or polyp.     Uterus is not enlarged or tender.  Breasts:    Right: No  mass, nipple discharge, skin change or tenderness.     Left: No mass, nipple discharge, skin change or tenderness.  Neck:     Thyroid: No thyromegaly.  Cardiovascular:     Rate and Rhythm: Normal rate and regular rhythm.     Heart sounds: Normal heart sounds. No murmur heard. Pulmonary:     Effort: Pulmonary effort is normal.     Breath sounds: Normal breath sounds.  Abdominal:     Palpations: Abdomen is soft.     Tenderness: There is no abdominal tenderness. There is no guarding or rebound.  Musculoskeletal:        General: Normal range of motion.     Cervical back: Normal range of motion.  Lymphadenopathy:     Cervical: No cervical adenopathy.  Neurological:     General: No focal deficit present.     Mental Status: She is alert and oriented to person, place, and time.     Cranial Nerves: No cranial nerve deficit.  Skin:    General: Skin is warm and dry.  Psychiatric:        Mood and Affect: Mood normal.        Behavior: Behavior normal.        Thought Content: Thought content normal.        Judgment: Judgment normal.  Vitals reviewed.    Assessment/Plan: Encounter for annual routine gynecological examination  Cervical cancer screening - Plan: Cytology -  PAP  Encounter for surveillance of contraceptive pills - Plan: drospirenone-ethinyl estradiol (SYEDA) 3-0.03 MG tablet; OCP RF.   Recommended Dr. Izora Ribas for breast reduction.    No orders of the defined types were placed in this encounter.            GYN counsel adequate intake of calcium and vitamin D, diet and exercise     F/U  No follow-ups on file.  Ayrton Mcvay B. Emmry Hinsch, PA-C 04/01/2023 7:00 PM

## 2023-04-02 ENCOUNTER — Other Ambulatory Visit (HOSPITAL_COMMUNITY)
Admission: RE | Admit: 2023-04-02 | Discharge: 2023-04-02 | Disposition: A | Discharge status: Home / Self Care | Payer: Commercial Managed Care - PPO | Source: Ambulatory Visit | Attending: Obstetrics and Gynecology | Admitting: Obstetrics and Gynecology

## 2023-04-02 ENCOUNTER — Ambulatory Visit (INDEPENDENT_AMBULATORY_CARE_PROVIDER_SITE_OTHER): Payer: Commercial Managed Care - PPO | Admitting: Obstetrics and Gynecology

## 2023-04-02 ENCOUNTER — Encounter: Payer: Self-pay | Admitting: Obstetrics and Gynecology

## 2023-04-02 VITALS — BP 108/66 | Ht 72.0 in | Wt 157.0 lb

## 2023-04-02 DIAGNOSIS — Z01419 Encounter for gynecological examination (general) (routine) without abnormal findings: Secondary | ICD-10-CM

## 2023-04-02 DIAGNOSIS — Z1151 Encounter for screening for human papillomavirus (HPV): Secondary | ICD-10-CM

## 2023-04-02 DIAGNOSIS — Z124 Encounter for screening for malignant neoplasm of cervix: Secondary | ICD-10-CM | POA: Diagnosis present

## 2023-04-02 DIAGNOSIS — R87612 Low grade squamous intraepithelial lesion on cytologic smear of cervix (LGSIL): Secondary | ICD-10-CM | POA: Diagnosis present

## 2023-04-02 DIAGNOSIS — A6004 Herpesviral vulvovaginitis: Secondary | ICD-10-CM

## 2023-04-02 DIAGNOSIS — Z3041 Encounter for surveillance of contraceptive pills: Secondary | ICD-10-CM

## 2023-04-02 MED ORDER — VALACYCLOVIR HCL 500 MG PO TABS: 500.0000 mg | ORAL_TABLET | Freq: Every day | ORAL | 3 refills | Status: DC

## 2023-04-02 NOTE — Patient Instructions (Signed)
I value your feedback and you entrusting us with your care. If you get a [facility] patient survey, I would appreciate you taking the time to let us know about your experience today. Thank you! ? ? ?

## 2023-04-09 ENCOUNTER — Encounter: Payer: Self-pay | Admitting: Obstetrics and Gynecology

## 2023-04-10 LAB — CYTOLOGY - PAP
Comment: NEGATIVE
Diagnosis: NEGATIVE
High risk HPV: NEGATIVE

## 2023-05-08 ENCOUNTER — Ambulatory Visit: Payer: Commercial Managed Care - PPO | Admitting: Obstetrics and Gynecology

## 2023-05-08 ENCOUNTER — Encounter: Payer: Self-pay | Admitting: Obstetrics and Gynecology

## 2023-05-08 VITALS — BP 154/72 | HR 97 | Ht 72.0 in | Wt 158.0 lb

## 2023-05-08 DIAGNOSIS — Z538 Procedure and treatment not carried out for other reasons: Secondary | ICD-10-CM

## 2023-05-08 DIAGNOSIS — Z30014 Encounter for initial prescription of intrauterine contraceptive device: Secondary | ICD-10-CM | POA: Diagnosis not present

## 2023-05-08 MED ORDER — MISOPROSTOL 100 MCG PO TABS
100.0000 ug | ORAL_TABLET | Freq: Once | ORAL | 0 refills | Status: DC
Start: 2023-05-08 — End: 2023-09-03

## 2023-05-08 NOTE — Progress Notes (Signed)
[facility]   Chief Complaint  Patient presents with   Contraception    IUD insertion, copper.    HPI:      [name] is a 32 y.o. G0P0000 whose LMP was Patient's last menstrual period was 05/07/2023 (exact date)., presents today for IUD insertion. Had skyla in the past, unable to insert Mirena 5/24 but pt wasn't on her period then. Pt has mood changes with multiple OCPs. Doesn't want to get pregnant. Question paragard vs Mirena IUD. Has bad periods off BC.   Her menses are monthly off OCPs, lasting 2-4 days, mod flow.  Dysmenorrhea mild, improved somewhat with NSAIDs. She does not have intermenstrual bleeding .   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   LGSIL on Pap smear of cervix 04/01/2023   Back pain 05/25/2022   Symptomatic mammary hypertrophy 05/25/2022   Hypokalemia 02/04/2022   Hypophosphatemia 02/03/2022   Alcoholic ketoacidosis 02/02/2022   Alcohol withdrawal syndrome (HCC) 02/02/2022   Hematemesis 02/02/2022   AKI (acute kidney injury) (HCC) 02/02/2022   Elevated lipase 02/02/2022   Hypercalcemia 02/02/2022   Hypomagnesemia 02/02/2022   Anxiety disorder 12/21/2021   Depression 12/21/2021   Alcohol use disorder, moderate, dependence (HCC) 12/21/2021   Eating disorder 12/21/2021   History of ADHD 12/21/2021   Iron deficiency anemia 09/04/2018    Past Surgical History:  Procedure Laterality Date   BREAST BIOPSY      Family History  Problem Relation Age of Onset   Depression Mother    Depression Father    Alcohol abuse Father    Mental illness Sister    Thyroid cancer Maternal Grandmother    Bladder Cancer Maternal Grandfather    Bipolar disorder Half-Sister    ADD / ADHD Half-Sister    Breast cancer Neg Hx    Ovarian cancer Neg Hx     Social History   Socioeconomic History   Marital status: Single    Spouse name: Not on file   Number of children: Not on file   Years of  education: Not on file   Highest education level: Some college, no degree  Occupational History   Not on file  Tobacco Use   Smoking status: Never   Smokeless tobacco: Never  Vaping Use   Vaping status: Every Day  Substance and Sexual Activity   Alcohol use: Not Currently    Alcohol/week: 42.0 standard drinks of alcohol    Types: 42 Shots of liquor per week    Comment: heavy abuse - 1 bottle last 2-2.5 days   Drug use: No   Sexual activity: Yes    Birth control/protection: None  Other Topics Concern   Not on file  Social History Narrative   Not on file   Social Drivers of Health   Financial Resource Strain: Low Risk  (09/16/2019)   Received from [facility], [facility]   Overall Financial Resource Strain (CARDIA)    Difficulty of Paying Living Expenses: Not hard at all  Food Insecurity: No Food Insecurity (09/16/2019)   Received from [facility], [facility]   Hunger Vital Sign    Worried About Running Out of Food in the Last Year: Never true    Ran Out of Food in the Last Year: Never true  Transportation Needs: No Transportation Needs (09/16/2019)   Received from [facility], [facility]  PRAPARE - Transportation    In the past 12 months, has lack of transportation kept you from medical appointments or from getting medications?: No    Lack of Transportation (Non-Medical): No  Physical Activity: Sufficiently Active (09/16/2019)   Received from [facility], [facility]   Exercise Vital Sign    Days of Exercise per Week: 7 days    Minutes of Exercise per Session: 60 min  Stress: No Stress Concern Present (09/16/2019)   [facility], [facility]   [employer] of Occupational Health - Occupational Stress Questionnaire    Feeling of Stress : Only a little  Social Connections:  Moderately Isolated (09/16/2019)   Received from [facility], [facility]   Social Connection and Isolation Panel [NHANES]    Frequency of Communication with Friends and Family: More than three times a week    Frequency of Social Gatherings with Friends and Family: Never    Attends Religious Services: Never    Database administrator or Organizations: Yes    Attends Engineer, structural: More than 4 times per year    Marital Status: Never married  Intimate Partner Violence: Not on file    Outpatient Medications Prior to Visit  Medication Sig Dispense Refill   buPROPion (WELLBUTRIN XL) 150 MG 24 hr tablet Take 1 tablet by mouth daily.     valACYclovir (VALTREX) 500 MG tablet Take 1 tablet (500 mg total) by mouth daily. 90 tablet 3   ipratropium (ATROVENT) 0.03 % nasal spray Place into the nose.     lidocaine (XYLOCAINE) 5 % ointment Apply 1 Application topically as needed. 30 g 0   No facility-administered medications prior to visit.      ROS:  Review of Systems  Constitutional:  Negative for fever.  Gastrointestinal:  Negative for blood in stool, constipation, diarrhea, nausea and vomiting.  Genitourinary:  Negative for dyspareunia, dysuria, flank pain, frequency, hematuria, urgency, vaginal bleeding, vaginal discharge and vaginal pain.  Musculoskeletal:  Negative for back pain.  Skin:  Negative for rash.   BREAST: No symptoms   OBJECTIVE:   Vitals:  BP (!) 154/72   Pulse 97   Ht 6' (1.829 m)   Wt 158 lb (71.7 kg)   LMP 05/07/2023 (Exact Date)   BMI 21.43 kg/m   Physical Exam Vitals reviewed.  Constitutional:      Appearance: She is well-developed.  Pulmonary:     Effort: Pulmonary effort is normal.  Musculoskeletal:        General: Normal range of motion.     Cervical back: Normal range of motion.  Skin:    General: Skin is warm and dry.  Neurological:     General: No focal deficit present.     Mental Status: She  is alert and oriented to person, place, and time.     Cranial Nerves: No cranial nerve deficit.  Psychiatric:        Mood and Affect: Mood normal.        Behavior: Behavior normal.        Thought Content: Thought content normal.        Judgment: Judgment normal.    IUD Insertion Procedure Note Patient identified, informed consent performed, consent signed.   Discussed risks of irregular bleeding, cramping, infection, malpositioning or misplacement of the IUD outside the uterus which may require further procedure such as laparoscopy, risk of failure <1%. Time  out was performed.  Urine pregnancy test negative.  Speculum placed in the vagina.  Cervix visualized.  Cleaned with Betadine x 2.  Grasped anteriorly with a single tooth tenaculum.  Uterus UNABLE TO BE SOUNDED EVEN WITH DILATORS DUE TO CLOSED INTERNAL CX OS.     Assessment/Plan: Encounter for initial prescription of intrauterine contraceptive device (IUD) - Plan: misoprostol (CYTOTEC) 100 MCG tablet; IUD options discussed. Pt would like to try Mirena for cycle control as well as BC. Attempted today.   Unsuccessful attempt to insert intrauterine device (IUD)--unable to penetrate through internal cx os. Rx cytotec, will RTO for Mirena insertion attempt again.    Meds ordered this encounter  Medications   misoprostol (CYTOTEC) 100 MCG tablet    Sig: Take 1 tablet (100 mcg total) by mouth once for 1 dose. 2-3 hours before appt    Dispense:  1 tablet    Refill:  0    Supervising Provider:   Hildred Laser [[id number]]      Return in about 1 day (around 05/09/2023) for Mirena insertion.  Clyde Upshaw B. Keara Pagliarulo, PA-C 05/08/2023 11:15 AM

## 2023-05-08 NOTE — Patient Instructions (Addendum)
I value your feedback and you entrusting us with your care. If you get a [facility] patient survey, I would appreciate you taking the time to let us know about your experience today. Thank you!  Instructions after IUD insertion  Most women experience no significant problems after insertion of an IUD, however minor cramping and spotting for a few days is common. Cramps may be treated with ibuprofen 800mg every 8 hours or Tylenol 650 mg every 4 hours. Contact [facility] immediately if you experience any of the following symptoms during the next week: temperature >99.6 degrees, worsening pelvic pain, abdominal pain, fainting, unusually heavy vaginal bleeding, foul vaginal discharge, or if you think you have expelled the IUD. Nothing inserted in the vagina for 48 hours. You will be scheduled for a follow up visit in approximately four weeks.    

## 2023-05-09 ENCOUNTER — Ambulatory Visit: Payer: Commercial Managed Care - PPO | Admitting: Obstetrics and Gynecology

## 2023-05-09 NOTE — Progress Notes (Deleted)
   No chief complaint on file.    IUD PROCEDURE NOTE:  Heather Kaiser is a 32 y.o. G0P0000 here for {type:23561}  IUD insertion for ***   LMP 05/07/2023 (Exact Date)   IUD Insertion Procedure Note Patient identified, informed consent performed, consent signed.   Discussed risks of irregular bleeding, cramping, infection, malpositioning or misplacement of the IUD outside the uterus which may require further procedure such as laparoscopy, risk of failure <1%. Time out was performed.  Urine pregnancy test negative.***  Speculum placed in the vagina.  Cervix visualized.  Cleaned with Betadine x 2.  Grasped anteriorly with a single tooth tenaculum.  Uterus sounded to *** cm.   IUD placed per manufacturer's recommendations.  Strings trimmed to 3 cm. Tenaculum was removed, good hemostasis noted.  Patient tolerated procedure well.   ASSESSMENT:  No diagnosis found.   No orders of the defined types were placed in this encounter.    Plan:  Patient was given post-procedure instructions.  She was advised to have backup contraception for one week.   Call if you are having increasing pain, cramps or bleeding or if you have a fever greater than 100.4 degrees F., shaking chills, nausea or vomiting. Patient was also asked to check IUD strings periodically and follow up in 4 weeks for IUD check.  No follow-ups on file.  Heather Kaiser B. Heather Janowicz, PA-C 05/09/2023 1:18 PM

## 2023-05-10 ENCOUNTER — Ambulatory Visit: Payer: Commercial Managed Care - PPO | Admitting: Obstetrics and Gynecology

## 2023-05-28 ENCOUNTER — Other Ambulatory Visit: Payer: Self-pay | Admitting: Obstetrics and Gynecology

## 2023-05-28 ENCOUNTER — Encounter: Payer: Self-pay | Admitting: Obstetrics and Gynecology

## 2023-05-28 DIAGNOSIS — N898 Other specified noninflammatory disorders of vagina: Secondary | ICD-10-CM

## 2023-05-28 MED ORDER — METRONIDAZOLE 500 MG PO TABS
500.0000 mg | ORAL_TABLET | Freq: Two times a day (BID) | ORAL | 0 refills | Status: DC
Start: 2023-05-28 — End: 2023-09-03

## 2023-05-28 NOTE — Progress Notes (Signed)
 Rx RF flagyl for BV sx, confirmed on culture in past

## 2023-05-28 NOTE — Progress Notes (Signed)
 Chief Complaint  Patient presents with  . Mass    3 lumps on neck, x 1 week     Heather Kaiser is a 33 y.o. female who is here for an acute visit.  She is here for concern of possible lymph nodes that she first noticed a week ago. She moved her hair and felt two lumps to right posterior cervical chain. She also has a lump closer to her occipital hairline that is tender to touch. She recalls muscle tension type pain two months ago but is not sure if this is related. She is not sure if these areas are cysts vs lymph nodes. She is unsure if lesions have been present longer than a week. She denies fever, sore throat, cough, or congestion. She denies night sweats or unintentional weight loss.  Patient Active Problem List  Diagnosis  . ADHD (attention deficit hyperactivity disorder)  . Anxiety  . Iron deficiency anemia    Past Medical History:  Diagnosis Date  . ADHD (attention deficit hyperactivity disorder)    prior ritalin and concerta  . Anemia   . Anxiety disorder   . GERD (gastroesophageal reflux disease)      Current Outpatient Medications:  .  buPROPion (WELLBUTRIN XL) 150 MG XL tablet, TAKE 1 TABLET BY MOUTH EVERY DAY, Disp: 90 tablet, Rfl: 0 .  valACYclovir  (VALTREX ) 500 MG tablet, Take 500 mg by mouth once daily, Disp: , Rfl:  .  cefdinir (OMNICEF) 300 mg capsule, Take 1 capsule (300 mg total) by mouth 2 (two) times daily for 7 days, Disp: 14 capsule, Rfl: 0  No Known Allergies  Results for orders placed or performed in visit on 01/16/23  CBC w/auto Differential (5 Part)  Result Value Ref Range   WBC (White Blood Cell Count) 6.8 4.1 - 10.2 10^3/uL   RBC (Red Blood Cell Count) 4.81 4.04 - 5.48 10^6/uL   Hemoglobin 14.1 12.0 - 15.0 gm/dL   Hematocrit 57.9 64.9 - 47.0 %   MCV (Mean Corpuscular Volume) 87.3 80.0 - 100.0 fl   MCH (Mean Corpuscular Hemoglobin) 29.3 27.0 - 31.2 pg   MCHC (Mean Corpuscular Hemoglobin Concentration) 33.6 32.0 - 36.0 gm/dL   Platelet Count 680 849 -  450 10^3/uL   RDW-CV (Red Cell Distribution Width) 12.6 11.6 - 14.8 %   MPV (Mean Platelet Volume) 9.8 9.4 - 12.4 fl   Neutrophils 4.15 1.50 - 7.80 10^3/uL   Lymphocytes 1.94 1.00 - 3.60 10^3/uL   Monocytes 0.40 0.00 - 1.50 10^3/uL   Eosinophils 0.26 0.00 - 0.55 10^3/uL   Basophils 0.06 0.00 - 0.09 10^3/uL   Neutrophil % 60.7 32.0 - 70.0 %   Lymphocyte % 28.4 10.0 - 50.0 %   Monocyte % 5.8 4.0 - 13.0 %   Eosinophil % 3.8 1.0 - 5.0 %   Basophil% 0.9 0.0 - 2.0 %   Immature Granulocyte % 0.4 <=0.7 %   Immature Granulocyte Count 0.03 <=0.06 10^3/L  Comprehensive Metabolic Panel (CMP)  Result Value Ref Range   Glucose 92 70 - 110 mg/dL   Sodium 859 863 - 854 mmol/L   Potassium 4.8 3.6 - 5.1 mmol/L   Chloride 103 97 - 109 mmol/L   Carbon Dioxide (CO2) 29.7 22.0 - 32.0 mmol/L   Urea Nitrogen (BUN) 20 7 - 25 mg/dL   Creatinine 1.0 0.6 - 1.1 mg/dL   Glomerular Filtration Rate (eGFR) 78 >60 mL/min/1.73sq m   Calcium 9.6 8.7 - 10.3 mg/dL   AST  16 8 -  39 U/L   ALT  8 5 - 38 U/L   Alk Phos (alkaline Phosphatase) 69 34 - 104 U/L   Albumin 4.4 3.5 - 4.8 g/dL   Bilirubin, Total 0.4 0.3 - 1.2 mg/dL   Protein, Total 7.1 6.1 - 7.9 g/dL   A/G Ratio 1.6 1.0 - 5.0 gm/dL  Hemoglobin J8R  Result Value Ref Range   Hemoglobin A1C 5.4 4.2 - 5.6 %   Average Blood Glucose (Calc) 108 mg/dL   Narrative   Normal Range:    4.2 - 5.6% Increased Risk:  5.7 - 6.4% Diabetes:        >= 6.5% Glycemic Control for adults with diabetes:  <7%    Thyroid  Stimulating-Hormone (TSH)  Result Value Ref Range   Thyroid  Stimulating Hormone (TSH) 1.813 0.450-5.330 uIU/ml uIU/mL  Vitamin D, 25-Hydroxy - [facility]  Result Value Ref Range   Vitamin D, 25-Hydroxy - [facility] 49.8 30.0 - 100.0 ng/mL   Narrative   Performed at:  [address] [facility] [address] [place], [place]  [id number] Lab Director: Frankey Sas MD, Phone:  [phone]  Vitamin B12  Result Value Ref Range   Vitamin B12 354 >300 pg/mL    Narrative   <200 pg/mL:    Low, consistent with Vitamin B12 Deficiency 200-300 pg/mL: Borderline, possible Vitamin B12 Deficiency >300 pg/mL:    Normal.  Vitamin B12 Deficiency is unlikely   Iron Panel  Result Value Ref Range   Iron 144 28 - 170 ug/dL   Total Iron Binding Capacity (TIBC) 380.2 261.0 - 478.0 ug/dL   Transferrin 728.3 796.9 - 362.0 mg/dL   % Saturation 38 %  Sedimentation Rate-Automated  Result Value Ref Range   Sedimentation Rate-Automated 6 0 - 30 mm/hr  C-Reactive Protein, Quant - [facility]  Result Value Ref Range   C Reactive Protein - [facility] 2 0 - 10 mg/L   Narrative   Performed at:  [address] [facility] [address], [place], [place]  [id number] Lab Director: Frankey Sas MD, Phone:  [phone]    Review of Systems:  No fever, chills, nausea, or vomiting  Physical Exam:  Vitals:   05/28/23 1444  BP: 106/68  BP Location: Left upper arm  Patient Position: Sitting  BP Cuff Size: Adult  Pulse: 94  SpO2: 99%  Weight: 71.1 kg (156 lb 12.8 oz)  Height: 182.9 cm (6' 0.01)    Body mass index is 21.26 kg/m.  General: Patient is well-groomed, well-nourished, appears stated age in no acute distress  HEENT: Head is atraumatic and normocephalic, neck is supple, occiput with small inflammatory nodule that is mildly tender to palpation there is possible soft mobile, small lesion adjacent to this lesion, inferior to this there are two small, mobile and non-tender lesions along right posterior cervical chain  CV: Regular rhythm and normal heart rate, no murmur  Respiratory: Clear to auscultation throughout lung fields with no wheezing, crackles, or rhonchi, no increased work of breathing  Assessment/Plan:  1. Lymphadenopathy She has noticed bumps to right posterior neck a week ago. She notes one near posterior hairline and two to right posterior cervical chain. Lesion near hairline has been more tender. She denies any other symptoms such as fever,  cough, congestion, fatigue, or drainage. She is worried that lesions have not changed and one is tender. She has not had much benefit with amoxicillin in the past. She has tolerated Cefdinir in the past. Will cover with course of antibiotic. Monitor for new or  worsening symptoms. Discussed likely reactive lymph nodes. Consider further evaluation with ultrasound if worsening or not improving after four weeks.  Other orders -     cefdinir (OMNICEF) 300 mg capsule; Take 1 capsule (300 mg total) by mouth 2 (two) times daily for 7 days     Attestation Statement:   I personally performed the service, non-incident to. (WP)   CELENA Friona, PA      Celena [place], PA-C

## 2023-06-02 ENCOUNTER — Encounter: Payer: Self-pay | Admitting: Obstetrics and Gynecology

## 2023-06-03 ENCOUNTER — Other Ambulatory Visit: Payer: Self-pay

## 2023-06-03 ENCOUNTER — Ambulatory Visit: Payer: Commercial Managed Care - PPO | Admitting: Obstetrics and Gynecology

## 2023-06-03 DIAGNOSIS — B379 Candidiasis, unspecified: Secondary | ICD-10-CM

## 2023-06-03 MED ORDER — FLUCONAZOLE 150 MG PO TABS
150.0000 mg | ORAL_TABLET | Freq: Every day | ORAL | 0 refills | Status: DC
Start: 2023-06-03 — End: 2023-09-03

## 2023-06-03 NOTE — Telephone Encounter (Signed)
 Appointment has been cancelled.

## 2023-06-05 MED ORDER — FLUCONAZOLE 150 MG PO TABS: 150.0000 mg | ORAL_TABLET | Freq: Every day | ORAL | 0 refills | Status: DC

## 2023-06-12 NOTE — Progress Notes (Signed)
 Heather Kaiser is a 33 y.o. female who is calling in for a Telehealth visit. A face-to-face video encounter was done using [employer].  This video encounter was conducted with the patient's (or proxy's) verbal consent via secure, interactive audio and video telecommunications while away from clinic/office/hospital.  The patient (or proxy) was instructed to have this encounter in a suitably private space and to only have persons present to whom they give permission to participate. In addition, patient identity was confirmed by use of name plus two identifiers.  This visit was coded based on medical decision making (MDM).   ---------------------------------------------  She was evaluated by me on 05/28/23 for concern of lymph nodes to right neck. There was small inflammatory nodule to right occiput with two smaller adjacent nodules to this lesion likely lymph nodes. She was not having an associated symptoms at that time. I had recommended coverage with Cefdinir and prednisone for inflammation and bacterial coverage. She has completed both without change in lesions. They are still present and tender. She has also had other non-specific symptoms such as facial flushing, flare of Raynaud's, and brain fog. She has not had energy with brain fog. She denies depression. She feels her anxiety is under controlled. She denies issues with relationship. She is on Wellbutrin for ADHD and feels that this has been under control as well.   Patient Active Problem List  Diagnosis  . ADHD (attention deficit hyperactivity disorder)  . Anxiety  . Iron deficiency anemia    Past Medical History:  Diagnosis Date  . ADHD (attention deficit hyperactivity disorder)    prior ritalin and concerta  . Anemia   . Anxiety disorder   . GERD (gastroesophageal reflux disease)     Current Outpatient Medications  Medication Sig Dispense Refill  . buPROPion (WELLBUTRIN XL) 150 MG XL tablet TAKE 1 TABLET BY MOUTH EVERY DAY 90 tablet 0   . predniSONE (DELTASONE) 10 MG tablet Take 1 tablet (10 mg total) by mouth once daily 10 tablet 0  . valACYclovir  (VALTREX ) 500 MG tablet Take 500 mg by mouth once daily     No current facility-administered medications for this visit.    No Known Allergies  Results for orders placed or performed in visit on 01/16/23  CBC w/auto Differential (5 Part)  Result Value Ref Range   WBC (White Blood Cell Count) 6.8 4.1 - 10.2 10^3/uL   RBC (Red Blood Cell Count) 4.81 4.04 - 5.48 10^6/uL   Hemoglobin 14.1 12.0 - 15.0 gm/dL   Hematocrit 57.9 64.9 - 47.0 %   MCV (Mean Corpuscular Volume) 87.3 80.0 - 100.0 fl   MCH (Mean Corpuscular Hemoglobin) 29.3 27.0 - 31.2 pg   MCHC (Mean Corpuscular Hemoglobin Concentration) 33.6 32.0 - 36.0 gm/dL   Platelet Count 680 849 - 450 10^3/uL   RDW-CV (Red Cell Distribution Width) 12.6 11.6 - 14.8 %   MPV (Mean Platelet Volume) 9.8 9.4 - 12.4 fl   Neutrophils 4.15 1.50 - 7.80 10^3/uL   Lymphocytes 1.94 1.00 - 3.60 10^3/uL   Monocytes 0.40 0.00 - 1.50 10^3/uL   Eosinophils 0.26 0.00 - 0.55 10^3/uL   Basophils 0.06 0.00 - 0.09 10^3/uL   Neutrophil % 60.7 32.0 - 70.0 %   Lymphocyte % 28.4 10.0 - 50.0 %   Monocyte % 5.8 4.0 - 13.0 %   Eosinophil % 3.8 1.0 - 5.0 %   Basophil% 0.9 0.0 - 2.0 %   Immature Granulocyte % 0.4 <=0.7 %   Immature Granulocyte Count  0.03 <=0.06 10^3/L  Comprehensive Metabolic Panel (CMP)  Result Value Ref Range   Glucose 92 70 - 110 mg/dL   Sodium 859 863 - 854 mmol/L   Potassium 4.8 3.6 - 5.1 mmol/L   Chloride 103 97 - 109 mmol/L   Carbon Dioxide (CO2) 29.7 22.0 - 32.0 mmol/L   Urea Nitrogen (BUN) 20 7 - 25 mg/dL   Creatinine 1.0 0.6 - 1.1 mg/dL   Glomerular Filtration Rate (eGFR) 78 >60 mL/min/1.73sq m   Calcium 9.6 8.7 - 10.3 mg/dL   AST  16 8 - 39 U/L   ALT  8 5 - 38 U/L   Alk Phos (alkaline Phosphatase) 69 34 - 104 U/L   Albumin 4.4 3.5 - 4.8 g/dL   Bilirubin, Total 0.4 0.3 - 1.2 mg/dL   Protein, Total 7.1 6.1 - 7.9 g/dL    A/G Ratio 1.6 1.0 - 5.0 gm/dL  Hemoglobin J8R  Result Value Ref Range   Hemoglobin A1C 5.4 4.2 - 5.6 %   Average Blood Glucose (Calc) 108 mg/dL   Narrative   Normal Range:    4.2 - 5.6% Increased Risk:  5.7 - 6.4% Diabetes:        >= 6.5% Glycemic Control for adults with diabetes:  <7%    Thyroid  Stimulating-Hormone (TSH)  Result Value Ref Range   Thyroid  Stimulating Hormone (TSH) 1.813 0.450-5.330 uIU/ml uIU/mL  Vitamin D, 25-Hydroxy - [facility]  Result Value Ref Range   Vitamin D, 25-Hydroxy - [facility] 49.8 30.0 - 100.0 ng/mL   Narrative   Performed at:  [address] [employer] [facility] [address] [place], [place]  [id number] Lab Director: Frankey Sas MD, Phone:  [phone]  Vitamin B12  Result Value Ref Range   Vitamin B12 354 >300 pg/mL   Narrative   <200 pg/mL:    Low, consistent with Vitamin B12 Deficiency 200-300 pg/mL: Borderline, possible Vitamin B12 Deficiency >300 pg/mL:    Normal.  Vitamin B12 Deficiency is unlikely   Iron Panel  Result Value Ref Range   Iron 144 28 - 170 ug/dL   Total Iron Binding Capacity (TIBC) 380.2 261.0 - 478.0 ug/dL   Transferrin 728.3 796.9 - 362.0 mg/dL   % Saturation 38 %  Sedimentation Rate-Automated  Result Value Ref Range   Sedimentation Rate-Automated 6 0 - 30 mm/hr  C-Reactive Protein, Quant - [facility]  Result Value Ref Range   C Reactive Protein - [facility] 2 0 - 10 mg/L   Narrative   Performed at:  [address] [employer] [address], [place], [place]  [id number] Lab Director: Frankey Sas MD, Phone:  [phone]    BP Readings from Last 3 Encounters:  05/28/23 106/68  12/07/22 126/68  11/07/22 112/64    Wt Readings from Last 3 Encounters:  05/28/23 71.1 kg (156 lb 12.8 oz)  02/03/23 70.3 kg (155 lb)  12/07/22 71 kg (156 lb 9.6 oz)      Review of Systems (as otherwise noted in HPI):  Negative for: fever, chills, nausea, or vomiting   Virtual Physical Exam:  General: Patient is well-groomed, well-nourished,  appears stated age, in no acute distress.  Respiratory: Patient is able to speak in complete sentences without obvious shortness of breath. No acute distress, accessory muscle use, or tachypnea.   Psych: Patient is alert and oriented, normal affect.    Assessment/Plan:  1. Lymphadenopathy of right cervical region Present for four weeks without improvement with prednisone and antibiotic. Will assess further with ultrasound. She has also  had other varying symptoms of facial flushing, discoloration/coolness of hands, and brain fog. She denies issue with anxiety or stress. I offered addition of venlafaxine but she declined as she does not feel that this is related to mood. If ultrasound is inconclusive and symptoms persist, consider further lab work-up.   -     US  soft tissue head and neck; Future   During this declared [employer] and Covid-19 pandemic, the patient has requested to obtain treatment via telehealth. Both patient and care provider will, using this treatment method, endeavor to provide care and treatment that cannot be done in office.  Under these emergent conditions it is the best (or only) means of connecting. The patient has been advised of the potential risks and limitations of this mode of treatment (including, but not limited to, the absence of in-person examination) and has agreed to be treated in a remote fashion in spite of them.  Any and all of the patient's/patient's family's questions on this issue have been answered, and I have made no promises or guarantees to the patient.  The patient has also been advised to contact this office for worsening conditions or problems, and seek medical treatment and/or call 911 if the patient deems either necessary.

## 2023-06-17 ENCOUNTER — Other Ambulatory Visit: Payer: Self-pay | Admitting: Family Medicine

## 2023-06-17 DIAGNOSIS — R590 Localized enlarged lymph nodes: Secondary | ICD-10-CM

## 2023-06-18 ENCOUNTER — Ambulatory Visit
Admission: RE | Admit: 2023-06-18 | Discharge: 2023-06-18 | Disposition: A | Discharge status: Home / Self Care | Payer: Commercial Managed Care - PPO | Source: Ambulatory Visit | Attending: Family Medicine | Admitting: Family Medicine

## 2023-06-18 DIAGNOSIS — R590 Localized enlarged lymph nodes: Secondary | ICD-10-CM | POA: Insufficient documentation

## 2023-07-22 ENCOUNTER — Encounter: Payer: Self-pay | Admitting: Obstetrics and Gynecology

## 2023-07-24 ENCOUNTER — Other Ambulatory Visit: Payer: Self-pay | Admitting: Obstetrics and Gynecology

## 2023-07-24 MED ORDER — DROSPIRENONE-ETHINYL ESTRADIOL 3-0.02 MG PO TABS: 1.0000 | ORAL_TABLET | Freq: Every day | ORAL | 2 refills | Status: DC

## 2023-07-24 NOTE — Progress Notes (Signed)
 Rx yaz for menses/moods/acne.

## 2023-08-29 ENCOUNTER — Encounter: Payer: Self-pay | Admitting: Obstetrics and Gynecology

## 2023-08-29 MED ORDER — METRONIDAZOLE 500 MG PO TABS: 500.0000 mg | ORAL_TABLET | Freq: Two times a day (BID) | ORAL | 0 refills | Status: DC

## 2023-09-02 NOTE — Progress Notes (Unsigned)
 [facility]   No chief complaint on file.   HPI:      [name] is a 33 y.o. 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 Feels like she has recurrent sx monthly. Notices painful bumps, not necessarily in same place as initial lesion, doesn't usually become blisters; also with inguinal LAN, occas pelvic pain. Takes valtrex BID for 3 days when has sx. Has a residual bump today on LT labia majora. Uses scented soap, dryer sheets, wears cotton underwear, shaves.   Told pt to RTO with sx for further eval.   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   LGSIL on Pap smear of cervix 04/01/2023   Back pain 05/25/2022   Symptomatic mammary hypertrophy 05/25/2022   Hypokalemia 02/04/2022   Hypophosphatemia 02/03/2022   Alcoholic ketoacidosis 02/02/2022   Alcohol withdrawal syndrome (HCC) 02/02/2022   Hematemesis 02/02/2022   AKI (acute kidney injury) (HCC) 02/02/2022   Elevated lipase 02/02/2022   Hypercalcemia 02/02/2022   Hypomagnesemia 02/02/2022   Anxiety disorder 12/21/2021   Depression 12/21/2021   Alcohol use disorder, moderate, dependence (HCC) 12/21/2021   Eating disorder 12/21/2021   History of ADHD 12/21/2021   Iron deficiency anemia 09/04/2018    Past Surgical History:  Procedure Laterality Date   BREAST BIOPSY      Family History  Problem Relation Age of Onset   Depression Mother    Depression Father    Alcohol abuse Father    Mental illness Sister    Thyroid cancer Maternal Grandmother    Bladder Cancer Maternal Grandfather    Bipolar disorder Half-Sister    ADD / ADHD Half-Sister    Breast cancer Neg Hx    Ovarian cancer Neg Hx     Social History   Socioeconomic History   Marital status: Single    Spouse name: Not on file   Number of children: Not on file   Years of education: Not on file    Highest education level: Some college, no degree  Occupational History   Not on file  Tobacco Use   Smoking status: Never   Smokeless tobacco: Never  Vaping Use   Vaping status: Every Day  Substance and Sexual Activity   Alcohol use: Not Currently    Alcohol/week: 42.0 standard drinks of alcohol    Types: 42 Shots of liquor per week    Comment: heavy abuse - 1 bottle last 2-2.5 days   Drug use: No   Sexual activity: Yes    Birth control/protection: None  Other Topics Concern   Not on file  Social History Narrative   Not on file   Social Drivers of Health   Financial Resource Strain: Low Risk  (09/16/2019)   Received from [facility], [facility]   Overall Financial Resource Strain (CARDIA)    Difficulty of Paying Living Expenses: Not hard at all  Food Insecurity: No Food Insecurity (09/16/2019)   Received from [facility], [facility]   Hunger Vital Sign    Worried About Running Out of Food in the Last Year: Never true    Ran Out of Food in the Last Year: Never true  Transportation Needs: No Transportation Needs (09/16/2019)   Received from [facility], [facility]   PRAPARE -  Transportation    In the past 12 months, has lack of transportation kept you from medical appointments or from getting medications?: No    Lack of Transportation (Non-Medical): No  Physical Activity: Sufficiently Active (09/16/2019)   Received from [facility], [facility]   Exercise Vital Sign    Days of Exercise per Week: 7 days    Minutes of Exercise per Session: 60 min  Stress: No Stress Concern Present (09/16/2019)   Received from [facility], [facility]   [employer] of Occupational Health - Occupational Stress Questionnaire    Feeling of Stress : Only a little  Social Connections: Moderately Isolated (09/16/2019)    Received from [facility], [facility]   Social Connection and Isolation Panel [NHANES]    Frequency of Communication with Friends and Family: More than three times a week    Frequency of Social Gatherings with Friends and Family: Never    Attends Religious Services: Never    Database administrator or Organizations: Yes    Attends Engineer, structural: More than 4 times per year    Marital Status: Never married  Intimate Partner Violence: Not on file    Outpatient Medications Prior to Visit  Medication Sig Dispense Refill   buPROPion (WELLBUTRIN XL) 150 MG 24 hr tablet Take 1 tablet by mouth daily.     drospirenone-ethinyl estradiol (YAZ) 3-0.02 MG tablet Take 1 tablet by mouth daily. 84 tablet 2   fluconazole (DIFLUCAN) 150 MG tablet Take 1 tablet (150 mg total) by mouth daily. 1 tablet 0   fluconazole (DIFLUCAN) 150 MG tablet Take 1 tablet (150 mg total) by mouth daily. 1 tablet 0   metroNIDAZOLE (FLAGYL) 500 MG tablet Take 1 tablet (500 mg total) by mouth 2 (two) times daily. 14 tablet 0   metroNIDAZOLE (FLAGYL) 500 MG tablet Take 1 tablet (500 mg total) by mouth 2 (two) times daily. 14 tablet 0   misoprostol (CYTOTEC) 100 MCG tablet Take 1 tablet (100 mcg total) by mouth once for 1 dose. 2-3 hours before appt 1 tablet 0   valACYclovir (VALTREX) 500 MG tablet Take 1 tablet (500 mg total) by mouth daily. 90 tablet 3   No facility-administered medications prior to visit.      ROS:  Review of Systems BREAST: No symptoms   OBJECTIVE:   Vitals:  There were no vitals taken for this visit.  Physical Exam  Results: No results found for this or any previous visit (from the past 24 hours).   Assessment/Plan: No diagnosis found.    No orders of the defined types were placed in this encounter.     No follow-ups on file.  Kayani Rapaport B. Anvi Mangal, PA-C 09/02/2023 4:51 PM

## 2023-09-03 ENCOUNTER — Encounter: Payer: Self-pay | Admitting: Obstetrics and Gynecology

## 2023-09-03 ENCOUNTER — Ambulatory Visit: Admitting: Obstetrics and Gynecology

## 2023-09-03 VITALS — BP 98/64 | Ht 72.0 in | Wt 158.0 lb

## 2023-09-03 DIAGNOSIS — N941 Unspecified dyspareunia: Secondary | ICD-10-CM | POA: Diagnosis not present

## 2023-09-03 DIAGNOSIS — A6004 Herpesviral vulvovaginitis: Secondary | ICD-10-CM

## 2023-09-03 DIAGNOSIS — N9089 Other specified noninflammatory disorders of vulva and perineum: Secondary | ICD-10-CM

## 2023-09-03 NOTE — Patient Instructions (Signed)
 I value your feedback and you entrusting [place] with your care. If you get a [place] patient survey, I would appreciate you taking the time to let us know about your experience today. Thank you! ? ? ?

## 2023-10-07 ENCOUNTER — Encounter: Payer: Self-pay | Admitting: Obstetrics and Gynecology

## 2023-10-08 ENCOUNTER — Other Ambulatory Visit: Payer: Self-pay | Admitting: Obstetrics and Gynecology

## 2023-10-08 MED ORDER — METRONIDAZOLE 500 MG PO TABS: 500.0000 mg | ORAL_TABLET | Freq: Two times a day (BID) | ORAL | 0 refills | Status: DC

## 2023-10-08 NOTE — Progress Notes (Signed)
Rx RF flagyl for BV sx.  

## 2023-10-17 NOTE — Progress Notes (Signed)
 Heather Kaiser is a 33 y.o. female in clinic today for face rash.  HPI: History of Present Illness Heather Kaiser is a 33 year old female who presents with a facial rash, now resolved and new breast pain.  She developed a facial rash resembling a 'butterfly rash,' which is a new occurrence for her. The rash has shown improvement since its onset, but she remains concerned about its appearance. There have been no changes in her skincare routine or exposure to potential triggers. Her past medical history includes Raynaud's syndrome.  She experiences significant breast pain, describing it as 'super, super tender' to touch. This pain is atypical for her, occurring mid-cycle, approximately two and a half weeks after her last normal menstrual period. Her breasts feel slightly swollen, but there are no lumps or nipple discharge. 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  She feels 'wiped out' and 'exhausted,' attributing this fatigue to stress or anxiety, although she does not feel particularly stressed. She has ceased exercising due to these symptoms. Her history of alcoholism, now in remission for two years, included significant health issues such as hospitalization for electrolyte imbalances and hemoptysis. Her father also struggled with alcoholism, which she refers to as a 'generational curse.'   ROS: Review of Systems  Constitutional:  Positive for malaise/fatigue. Negative for chills, diaphoresis, fever and weight loss.  Respiratory:  Negative for shortness of breath.   Cardiovascular:  Negative for chest pain and palpitations.  Musculoskeletal:  Positive for myalgias.  Skin:  Positive for rash. Negative for itching.     No Known Allergies  Past Medical History:  Diagnosis Date  . ADHD (attention deficit hyperactivity disorder)    prior ritalin and concerta  . Anemia   . Anxiety disorder   . GERD (gastroesophageal reflux disease)       BP 110/70    Pulse 88   Temp 36.6 C (97.9 F)   Ht 179.1 cm (5' 10.5)   Wt 72.1 kg (159 lb)   SpO2 99%   BMI 22.49 kg/m    Physical Exam Exam conducted with a chaperone present [employer]).  Constitutional:      General: She is not in acute distress.    Appearance: Normal appearance.  HENT:     Head: Normocephalic and atraumatic.  Pulmonary:     Effort: Pulmonary effort is normal.  Chest:     Chest wall: Tenderness (upper outer aspects of each breast, primarily at chest wall) present. No deformity or swelling.  Breasts:    Tanner Score is 5.     Right: Mass (patient has known benign mass, denies changes) present.     Left: Normal.  Lymphadenopathy:     Upper Body:     Right upper body: No supraclavicular, axillary or pectoral adenopathy.     Left upper body: No supraclavicular, axillary or pectoral adenopathy.  Neurological:     General: No focal deficit present.     Mental Status: She is alert and oriented to person, place, and time.  Psychiatric:        Mood and Affect: Mood and affect normal.        Speech: Speech normal.        Thought Content: Thought content normal.      Plan: Assessment & Plan Rash Facial rash consistent with rosacea, primarily on cheeks, chin, and forehead. Differential diagnosis includes lupus. - Order inflammatory markers and ANA to rule out lupus. -  Consider topical treatments for rosacea if rash recurs.  Breast pain Mid-cycle bilateral breast pain with tenderness at chest wall for last two days. No lumps or nipple discharge. History of benign tumor in right breast. Question musculoskeletal source. Breast exam reassuring. - Consider mammogram if breast pain persists or recurs.  Alcoholism Recovering alcoholic, sober for nearly two years. History of hospitalization due to electrolyte imbalance and complications from alcohol  use.  Follow-up - Follow up with results, if rash recurs or if breast pain persists.

## 2023-10-31 ENCOUNTER — Telehealth: Payer: Self-pay | Admitting: Obstetrics and Gynecology

## 2023-10-31 DIAGNOSIS — F3281 Premenstrual dysphoric disorder: Secondary | ICD-10-CM

## 2023-10-31 MED ORDER — FLUOXETINE HCL 10 MG PO CAPS: 10.0000 mg | ORAL_CAPSULE | Freq: Every day | ORAL | 1 refills | Status: DC

## 2023-10-31 NOTE — Telephone Encounter (Signed)
 Rx prozac for PMDD sx.

## 2023-11-06 MED ORDER — CLINDAMYCIN HCL 300 MG PO CAPS: 300.0000 mg | ORAL_CAPSULE | Freq: Two times a day (BID) | ORAL | 0 refills | Status: DC

## 2023-11-06 NOTE — Addendum Note (Signed)
 Addended by: Alyn Judge B on: 11/06/2023 07:38 PM   Modules accepted: Orders

## 2023-11-21 ENCOUNTER — Encounter: Payer: Self-pay | Admitting: Obstetrics and Gynecology

## 2023-11-24 ENCOUNTER — Other Ambulatory Visit: Payer: Self-pay | Admitting: Obstetrics and Gynecology

## 2023-11-24 DIAGNOSIS — F3281 Premenstrual dysphoric disorder: Secondary | ICD-10-CM

## 2023-12-05 ENCOUNTER — Other Ambulatory Visit: Payer: Self-pay | Admitting: Obstetrics and Gynecology

## 2023-12-05 ENCOUNTER — Encounter: Payer: Self-pay | Admitting: Obstetrics and Gynecology

## 2023-12-05 DIAGNOSIS — B9689 Other specified bacterial agents as the cause of diseases classified elsewhere: Secondary | ICD-10-CM

## 2023-12-05 MED ORDER — CLINDAMYCIN HCL 300 MG PO CAPS
300.0000 mg | ORAL_CAPSULE | Freq: Two times a day (BID) | ORAL | 0 refills | Status: AC
Start: 2023-12-05 — End: 2023-12-12

## 2023-12-05 MED ORDER — METRONIDAZOLE 0.75 % VA GEL: VAGINAL | 2 refills | Status: DC

## 2023-12-05 NOTE — Progress Notes (Signed)
 Rx clindamycin  for BV with metrogel  twice wkly as preventive, start after completion of clinda.

## 2023-12-15 ENCOUNTER — Encounter: Payer: Self-pay | Admitting: Obstetrics and Gynecology

## 2023-12-15 DIAGNOSIS — N939 Abnormal uterine and vaginal bleeding, unspecified: Secondary | ICD-10-CM

## 2023-12-15 DIAGNOSIS — R102 Pelvic and perineal pain: Secondary | ICD-10-CM

## 2023-12-16 NOTE — Telephone Encounter (Signed)
 Pt to RTO tomorrow for GYN u/s work-in and CBC labs. Left work early today due to fatigue. Neg GYN u/s 2022 and neg abd/pelvic CT 2023. Having mood changes with yaz and other OCPs, unable to insert IUD at last visit but good do on menses and with cytotec .

## 2023-12-17 ENCOUNTER — Other Ambulatory Visit

## 2023-12-17 DIAGNOSIS — N939 Abnormal uterine and vaginal bleeding, unspecified: Secondary | ICD-10-CM | POA: Diagnosis not present

## 2023-12-17 DIAGNOSIS — R102 Pelvic and perineal pain: Secondary | ICD-10-CM

## 2023-12-18 LAB — CBC WITH DIFFERENTIAL/PLATELET
Basophils Absolute: 0.1 x10E3/uL (ref 0.0–0.2)
Basos: 1 %
EOS (ABSOLUTE): 0.3 x10E3/uL (ref 0.0–0.4)
Eos: 4 %
Hematocrit: 43.5 % (ref 34.0–46.6)
Hemoglobin: 14.4 g/dL (ref 11.1–15.9)
Immature Grans (Abs): 0 x10E3/uL (ref 0.0–0.1)
Immature Granulocytes: 0 %
Lymphocytes Absolute: 2.5 x10E3/uL (ref 0.7–3.1)
Lymphs: 29 %
MCH: 30.4 pg (ref 26.6–33.0)
MCHC: 33.1 g/dL (ref 31.5–35.7)
MCV: 92 fL (ref 79–97)
Monocytes Absolute: 0.4 x10E3/uL (ref 0.1–0.9)
Monocytes: 4 %
Neutrophils Absolute: 5.5 x10E3/uL (ref 1.4–7.0)
Neutrophils: 62 %
Platelets: 308 x10E3/uL (ref 150–450)
RBC: 4.73 x10E6/uL (ref 3.77–5.28)
RDW: 11.9 % (ref 11.7–15.4)
WBC: 8.8 x10E3/uL (ref 3.4–10.8)

## 2023-12-30 ENCOUNTER — Encounter: Payer: Self-pay | Admitting: Obstetrics and Gynecology

## 2024-01-08 ENCOUNTER — Encounter: Payer: Self-pay | Admitting: Obstetrics and Gynecology

## 2024-01-13 ENCOUNTER — Ambulatory Visit: Admitting: Obstetrics and Gynecology

## 2024-01-15 ENCOUNTER — Ambulatory Visit: Admitting: Obstetrics and Gynecology

## 2024-01-15 ENCOUNTER — Encounter: Payer: Self-pay | Admitting: Obstetrics and Gynecology

## 2024-01-15 VITALS — BP 92/61 | HR 90 | Ht 72.0 in | Wt 160.0 lb

## 2024-01-15 DIAGNOSIS — Z3202 Encounter for pregnancy test, result negative: Secondary | ICD-10-CM | POA: Diagnosis not present

## 2024-01-15 DIAGNOSIS — R102 Pelvic and perineal pain: Secondary | ICD-10-CM | POA: Diagnosis not present

## 2024-01-15 LAB — POCT URINALYSIS DIPSTICK
Bilirubin, UA: NEGATIVE
Blood, UA: NEGATIVE
Glucose, UA: NEGATIVE
Ketones, UA: NEGATIVE
Leukocytes, UA: NEGATIVE
Nitrite, UA: NEGATIVE
Protein, UA: NEGATIVE
Spec Grav, UA: 1.01 (ref 1.010–1.025)
pH, UA: 7 (ref 5.0–8.0)

## 2024-01-15 LAB — POCT URINE PREGNANCY: Preg Test, Ur: NEGATIVE

## 2024-01-15 NOTE — Progress Notes (Signed)
 [facility]   Chief Complaint  Patient presents with   Pelvic Pain    Sharp pain that started after intercourse, never happened before. Today pelvic area feels huge/pressure.    HPI:      [name] is a 33 y.o. G0P0000 whose LMP was Patient's last menstrual period was 12/28/2023 (approximate)., presents today for pelvic pain that started after sex last night. Was intense sharp pain suprapubic area that radiated to LLQ, somewhat crampy too. Pt took ibup and sx improved; still tender today but no pain. Never happened before and pt didn't have sx when sexually active 2 days prior. Hx of dyspareunia based on certain positions anyway but even more uncomfortable last night. No new sexual partners for many yrs. Has been having recent hip pain. No GI sx, has 2 BMs daily which is normal for her. No UTI sx, no vag sx. No bleeding with sex. Neg Gyn u/s 12/17/23 for AUB on OCPs. Unable to insert IUD due to closed cx os so pt tried OCPs for cycle control but stopped with LMP; prefers no BC for now. Mood changes and acne improving already off OCPs. LMP was 3 mod to heavy days then stopped, only mild dysmen. Has had breast tenderness and bloating since off OCPs.  Hx of recurrent BV, pt has now started metrogel  twice wkly as preventive. Completed flagyl  a week ago. Using boric acid supp and probiotics.   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   LGSIL on Pap smear of cervix 04/01/2023   Back pain 05/25/2022   Symptomatic mammary hypertrophy 05/25/2022   Hypokalemia 02/04/2022   Hypophosphatemia 02/03/2022   Alcoholic ketoacidosis 02/02/2022   Alcohol  withdrawal syndrome (HCC) 02/02/2022   Hematemesis 02/02/2022   AKI (acute kidney injury) (HCC) 02/02/2022   Elevated lipase 02/02/2022   Hypercalcemia 02/02/2022   Hypomagnesemia 02/02/2022   Anxiety disorder 12/21/2021   Depression 12/21/2021   Alcohol  use disorder, moderate,  dependence (HCC) 12/21/2021   Eating disorder 12/21/2021   History of ADHD 12/21/2021   Iron deficiency anemia 09/04/2018    Past Surgical History:  Procedure Laterality Date   BREAST BIOPSY      Family History  Problem Relation Age of Onset   Depression Mother    Depression Father    Alcohol  abuse Father    Mental illness Sister    Thyroid  cancer Maternal Grandmother    Bladder Cancer Maternal Grandfather    Bipolar disorder Half-Sister    ADD / ADHD Half-Sister    Breast cancer Neg Hx    Ovarian cancer Neg Hx     Social History   Socioeconomic History   Marital status: Single    Spouse name: Not on file   Number of children: Not on file   Years of education: Not on file   Highest education level: Some college, no degree  Occupational History   Not on file  Tobacco Use   Smoking status: Never   Smokeless tobacco: Never  Vaping Use   Vaping status: Every Day  Substance and Sexual Activity   Alcohol  use: Not Currently    Alcohol /week: 42.0 standard drinks of alcohol     Types: 42 Shots of liquor per week    Comment: heavy abuse - 1 bottle last 2-2.5 days   Drug use: No   Sexual activity: Yes    Birth control/protection: Pill  Other Topics Concern   Not on file  Social  History Narrative   Not on file   Social Drivers of Health   Financial Resource Strain: Low Risk  (09/16/2019)   Received from [facility]   Overall Financial Resource Strain (CARDIA)    Difficulty of Paying Living Expenses: Not hard at all  Food Insecurity: No Food Insecurity (09/16/2019)   Received from [facility]   Hunger Vital Sign    Within the past 12 months, you worried that your food would run out before you got the money to buy more.: Never true    Within the past 12 months, the food you bought just didn't last and you didn't have money to get more.: Never true  Transportation Needs: No Transportation Needs (09/16/2019)   Received from [facility] - Transportation    In the past 12 months, has lack of transportation kept you from medical appointments or from getting medications?: No    Lack of Transportation (Non-Medical): No  Physical Activity: Sufficiently Active (09/16/2019)   Received from [facility]   Exercise Vital Sign    On average, how many days per week do you engage in moderate to strenuous exercise (like a brisk walk)?: 7 days    On average, how many minutes do you engage in exercise at this level?: 60 min  Stress: No Stress Concern Present (09/16/2019)   Received from [facility] Health - Occupational Stress Questionnaire    Feeling of Stress : Only a little  Social Connections: Moderately Isolated (09/16/2019)   Received from [facility]   Social Connection and Isolation Panel    In a typical week, how many times do you talk on the phone with family, friends, or neighbors?: More than three times a week    How often do you get together with friends or relatives?: Never    How often do you attend church or religious services?: Never    Do you belong to any clubs or organizations such as church groups, unions, fraternal or athletic groups, or school groups?: Yes    How often do you attend meetings of the clubs or organizations you belong to?: More than 4 times per year    Are you married, widowed, divorced, separated, never married, or living with a partner?: Never married  Intimate Partner Violence: Not on file    Outpatient Medications Prior to Visit  Medication Sig Dispense Refill   FLUoxetine  (PROZAC ) 10 MG capsule Take 1 capsule (10 mg total) by mouth daily. 30 capsule 1   metroNIDAZOLE  (METROGEL ) 0.75 % vaginal gel Insert 1 applicatorful vaginally twice weekly as preventive for 3 months 70 g 2   valACYclovir  (VALTREX ) 500 MG tablet Take 1 tablet (500 mg total) by mouth daily. 90 tablet 3    drospirenone -ethinyl estradiol  (YAZ) 3-0.02 MG tablet Take 1 tablet by mouth daily. (Patient not taking: Reported on 01/15/2024) 84 tablet 2   buPROPion (WELLBUTRIN XL) 150 MG 24 hr tablet Take 1 tablet by mouth daily. (Patient not taking: Reported on 09/03/2023)     fluconazole  (DIFLUCAN ) 150 MG tablet Take 1 tablet (150 mg total) by mouth daily. (Patient not taking: Reported on 09/03/2023) 1 tablet 0   No facility-administered medications prior to visit.      ROS:  Review of Systems  Constitutional:  Negative for fever.  Gastrointestinal:  Negative for blood in stool, constipation, diarrhea, nausea and vomiting.  Genitourinary:  Positive for pelvic pain. Negative for dyspareunia, dysuria, flank pain, frequency, hematuria, urgency, vaginal bleeding, vaginal discharge and vaginal pain.  Musculoskeletal:  Negative for back pain.  Skin:  Negative for rash.   BREAST: No symptoms   OBJECTIVE:   Vitals:  BP 92/61   Pulse 90   Ht 6' (1.829 m)   Wt 160 lb (72.6 kg)   LMP 12/28/2023 (Approximate)   BMI 21.70 kg/m   Physical Exam Vitals reviewed.  Constitutional:      Appearance: She is well-developed.  Pulmonary:     Effort: Pulmonary effort is normal.  Abdominal:     Palpations: Abdomen is soft.     Tenderness: There is abdominal tenderness in the right lower quadrant, suprapubic area and left lower quadrant. There is no guarding or rebound.  Genitourinary:    General: Normal vulva.     Pubic Area: No rash.      Labia:        Right: No rash, tenderness or lesion.        Left: No rash, tenderness or lesion.      Vagina: Normal. No vaginal discharge, erythema or tenderness.     Cervix: Normal.     Uterus: Normal. Tender. Not enlarged.      Adnexa: Right adnexa normal and left adnexa normal.       Right: No mass or tenderness.         Left: No mass or tenderness.       Comments: CX TENDER TO PALPATE BUT NO CMT Musculoskeletal:        General: Normal range of motion.      Cervical back: Normal range of motion.  Skin:    General: Skin is warm and dry.  Neurological:     General: No focal deficit present.     Mental Status: She is alert and oriented to person, place, and time.  Psychiatric:        Mood and Affect: Mood normal.        Behavior: Behavior normal.        Thought Content: Thought content normal.        Judgment: Judgment normal.     Results: Results for orders placed or performed in visit on 01/15/24 (from the past 24 hours)  POCT urine pregnancy     Status: Normal   Collection Time: 01/15/24  4:07 PM  Result Value Ref Range   Preg Test, Ur Negative Negative  POCT Urinalysis Dipstick     Status: Normal   Collection Time: 01/15/24  4:31 PM  Result Value Ref Range   Color, UA pale    Clarity, UA clear    Glucose, UA Negative Negative   Bilirubin, UA neg    Ketones, UA neg    Spec Grav, UA 1.010 1.010 - 1.025   Blood, UA neg    pH, UA 7.0 5.0 - 8.0   Protein, UA Negative Negative   Urobilinogen, UA     Nitrite, UA neg    Leukocytes, UA Negative Negative   Appearance     Odor       Assessment/Plan: Pelvic pain - Plan: POCT urine pregnancy, POCT Urinalysis Dipstick; neg UA, neg UPT. Tender on exam but sx better today. Started after sex. Neg GYN u/s 1 month ago. Question ovar cyst vs GI with increased bloating vs MSK. Pt to follow sx since better today. If sx recur, will check Gyn u/s (no openings today or this wk in office).  NSAIDs prn. F/u prn.     Return if symptoms worsen or fail to improve.  Rett Stehlik B. Britanny Marksberry, PA-C 01/15/2024 4:32 PM

## 2024-01-15 NOTE — Patient Instructions (Signed)
 I value your feedback and you entrusting [place] with your care. If you get a [place] patient survey, I would appreciate you taking the time to let us know about your experience today. Thank you! ? ? ?

## 2024-01-26 ENCOUNTER — Encounter: Payer: Self-pay | Admitting: Obstetrics and Gynecology

## 2024-02-07 MED ORDER — FLUCONAZOLE 150 MG PO TABS
150.0000 mg | ORAL_TABLET | Freq: Once | ORAL | 0 refills | Status: AC
Start: 2024-02-07 — End: 2024-02-07

## 2024-03-25 ENCOUNTER — Encounter: Payer: Self-pay | Admitting: Obstetrics and Gynecology

## 2024-04-10 ENCOUNTER — Other Ambulatory Visit: Payer: Self-pay | Admitting: Obstetrics and Gynecology

## 2024-04-10 DIAGNOSIS — A6004 Herpesviral vulvovaginitis: Secondary | ICD-10-CM

## 2024-05-04 ENCOUNTER — Other Ambulatory Visit: Payer: Self-pay

## 2024-05-04 DIAGNOSIS — A6004 Herpesviral vulvovaginitis: Secondary | ICD-10-CM

## 2024-05-04 MED ORDER — VALACYCLOVIR HCL 500 MG PO TABS: 500.0000 mg | ORAL_TABLET | Freq: Every day | ORAL | 0 refills | Status: DC

## 2024-05-04 NOTE — Progress Notes (Signed)
 Pt has annual with Harlene Cisco CNM 05/25/24.

## 2024-05-25 ENCOUNTER — Encounter: Payer: Self-pay | Admitting: Certified Nurse Midwife

## 2024-05-25 ENCOUNTER — Ambulatory Visit: Admitting: Certified Nurse Midwife

## 2024-05-25 VITALS — BP 101/73 | HR 97 | Ht 71.0 in | Wt 162.2 lb

## 2024-05-25 DIAGNOSIS — A6004 Herpesviral vulvovaginitis: Secondary | ICD-10-CM

## 2024-05-25 DIAGNOSIS — F0630 Mood disorder due to known physiological condition, unspecified: Secondary | ICD-10-CM

## 2024-05-25 MED ORDER — VALACYCLOVIR HCL 500 MG PO TABS: 500.0000 mg | ORAL_TABLET | Freq: Every day | ORAL | 4 refills | Status: AC

## 2024-05-25 NOTE — Progress Notes (Signed)
 "   Pcp, No   Chief Complaint  Patient presents with   Medication Refill    HPI:      Heather Kaiser is a 34 y.o. G0P0000 whose LMP was Patient's last menstrual period was 04/28/2024., presents today for refill of valacyclovir  for suppression. Would like to discuss alternative options for management of PMDD symptoms, varies month to month, sometimes mild symptoms & sometimes feels like a different person. Has not had relief with oral contraceptives or SSRIs.     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   LGSIL on Pap smear of cervix 04/01/2023   Back pain 05/25/2022   Symptomatic mammary hypertrophy 05/25/2022   Hypokalemia 02/04/2022   Hypophosphatemia 02/03/2022   Alcoholic ketoacidosis 02/02/2022   Alcohol  withdrawal syndrome (HCC) 02/02/2022   Hematemesis 02/02/2022   AKI (acute kidney injury) 02/02/2022   Elevated lipase 02/02/2022   Hypercalcemia 02/02/2022   Hypomagnesemia 02/02/2022   Anxiety disorder 12/21/2021   Depression 12/21/2021   Alcohol  use disorder, moderate, dependence (HCC) 12/21/2021   Eating disorder 12/21/2021   History of ADHD 12/21/2021   Iron deficiency anemia 09/04/2018    Past Surgical History:  Procedure Laterality Date   BREAST BIOPSY      Family History  Problem Relation Age of Onset   Depression Mother    Depression Father    Alcohol  abuse Father    Mental illness Sister    Thyroid  cancer Maternal Grandmother    Bladder Cancer Maternal Grandfather    Bipolar disorder Half-Sister    ADD / ADHD Half-Sister    Breast cancer Neg Hx    Ovarian cancer Neg Hx     Social History   Socioeconomic History   Marital status: Single    Spouse name: Not on file   Number of children: Not on file   Years of education: Not on file   Highest education level: Some college, no degree  Occupational History   Not on file  Tobacco Use   Smoking status: Never   Smokeless tobacco: Never   Vaping Use   Vaping status: Every Day  Substance and Sexual Activity   Alcohol  use: Not Currently    Alcohol /week: 42.0 standard drinks of alcohol     Types: 42 Shots of liquor per week    Comment: heavy abuse - 1 bottle last 2-2.5 days   Drug use: No   Sexual activity: Yes    Birth control/protection: Pill  Other Topics Concern   Not on file  Social History Narrative   Not on file   Social Drivers of Health   Tobacco Use: Low Risk (01/15/2024)   Patient History    Smoking Tobacco Use: Never    Smokeless Tobacco Use: Never    Passive Exposure: Not on file  Financial Resource Strain: Not on file  Food Insecurity: Not on file  Transportation Needs: Not on file  Physical Activity: Not on file  Stress: Not on file  Social Connections: Not on file  Intimate Partner Violence: Not on file  Depression (PHQ2-9): High Risk (02/07/2022)   Depression (PHQ2-9)    PHQ-2 Score: 19  Alcohol  Screen: High Risk (12/21/2021)   Alcohol  Screen    Last Alcohol  Screening Score (AUDIT): 31  Housing: Unknown (06/12/2023)   Received from [facility]   Epic    Unable to Pay for Housing in the Last Year: Not on file    Number of Times Moved  in the Last Year: Not on file    At any time in the past 12 months, were you homeless or living in a shelter (including now)?: No  Utilities: Not on file  Health Literacy: Not on file    Outpatient Medications Prior to Visit  Medication Sig Dispense Refill   fluticasone (FLONASE) 50 MCG/ACT nasal spray SMARTSIG:1-2 Spray(s) Both Nares Daily     valACYclovir  (VALTREX ) 500 MG tablet Take 1 tablet (500 mg total) by mouth daily. 90 tablet 0   drospirenone -ethinyl estradiol  (YAZ) 3-0.02 MG tablet Take 1 tablet by mouth daily. (Patient not taking: Reported on 01/15/2024) 84 tablet 2   FLUoxetine  (PROZAC ) 10 MG capsule Take 1 capsule (10 mg total) by mouth daily. 30 capsule 1   metroNIDAZOLE  (METROGEL ) 0.75 % vaginal gel Insert 1 applicatorful  vaginally twice weekly as preventive for 3 months 70 g 2   No facility-administered medications prior to visit.      ROS:  Review of Systems  Constitutional: Negative.   Genitourinary:  Positive for menstrual problem.     OBJECTIVE:   Vitals:  BP 101/73   Pulse 97   Ht 5' 11 (1.803 m)   Wt 162 lb 3.2 oz (73.6 kg)   LMP 04/28/2024   BMI 22.62 kg/m   Physical Exam Vitals reviewed.  Constitutional:      General: She is not in acute distress.    Appearance: Normal appearance.  Cardiovascular:     Rate and Rhythm: Normal rate.  Pulmonary:     Effort: Pulmonary effort is normal.  Neurological:     General: No focal deficit present.     Mental Status: She is alert and oriented to person, place, and time.  Psychiatric:        Mood and Affect: Mood normal.        Behavior: Behavior normal.     Results: No results found for this or any previous visit (from the past 24 hours).   Assessment/Plan: 1. Herpes simplex vulvovaginitis (Primary) - valACYclovir  (VALTREX ) 500 MG tablet; Take 1 tablet (500 mg total) by mouth daily.  Dispense: 90 tablet; Refill: 4  2. Menstrual-related mood disorder - Herbal & supplement options for mood regulation in luteal phase reviewed. Consider acupuncture as an adjunct.    Meds ordered this encounter  Medications   valACYclovir  (VALTREX ) 500 MG tablet    Sig: Take 1 tablet (500 mg total) by mouth daily.    Dispense:  90 tablet    Refill:  4     Harlene LITTIE Cisco, CNM 05/25/2024 1:25 PM       "

## 2024-06-18 ENCOUNTER — Encounter: Payer: Self-pay | Admitting: Certified Nurse Midwife
